# Patient Record
Sex: Female | Born: 1955 | Race: Black or African American | Hispanic: No | Smoking: Never smoker
Health system: Southern US, Community
[De-identification: ages and names within clinical notes are randomized; demographics above are authoritative.]

---

## 2014-09-10 DIAGNOSIS — L659 Nonscarring hair loss, unspecified: Secondary | ICD-10-CM | POA: Insufficient documentation

## 2014-09-10 DIAGNOSIS — K439 Ventral hernia without obstruction or gangrene: Secondary | ICD-10-CM | POA: Insufficient documentation

## 2014-10-23 DIAGNOSIS — K429 Umbilical hernia without obstruction or gangrene: Secondary | ICD-10-CM | POA: Insufficient documentation

## 2015-02-08 DIAGNOSIS — L0291 Cutaneous abscess, unspecified: Secondary | ICD-10-CM | POA: Insufficient documentation

## 2015-02-08 DIAGNOSIS — L0390 Cellulitis, unspecified: Secondary | ICD-10-CM | POA: Insufficient documentation

## 2020-09-02 DIAGNOSIS — L409 Psoriasis, unspecified: Secondary | ICD-10-CM | POA: Insufficient documentation

## 2020-09-02 DIAGNOSIS — I10 Essential (primary) hypertension: Secondary | ICD-10-CM | POA: Insufficient documentation

## 2020-09-02 DIAGNOSIS — IMO0002 Reserved for concepts with insufficient information to code with codable children: Secondary | ICD-10-CM | POA: Insufficient documentation

## 2020-09-02 DIAGNOSIS — E559 Vitamin D deficiency, unspecified: Secondary | ICD-10-CM | POA: Insufficient documentation

## 2020-09-02 DIAGNOSIS — E785 Hyperlipidemia, unspecified: Secondary | ICD-10-CM | POA: Insufficient documentation

## 2020-09-02 DIAGNOSIS — K219 Gastro-esophageal reflux disease without esophagitis: Secondary | ICD-10-CM | POA: Insufficient documentation

## 2020-09-02 DIAGNOSIS — M170 Bilateral primary osteoarthritis of knee: Secondary | ICD-10-CM | POA: Insufficient documentation

## 2020-09-02 DIAGNOSIS — K589 Irritable bowel syndrome without diarrhea: Secondary | ICD-10-CM | POA: Insufficient documentation

## 2020-09-02 DIAGNOSIS — E119 Type 2 diabetes mellitus without complications: Secondary | ICD-10-CM | POA: Insufficient documentation

## 2022-01-08 ENCOUNTER — Telehealth: Payer: Self-pay

## 2022-01-08 NOTE — Telephone Encounter (Signed)
LMOM to call back to set up appointment

## 2022-01-10 ENCOUNTER — Other Ambulatory Visit: Payer: Self-pay

## 2022-01-10 ENCOUNTER — Ambulatory Visit (INDEPENDENT_AMBULATORY_CARE_PROVIDER_SITE_OTHER): Payer: Managed Care, Other (non HMO) | Admitting: Podiatry

## 2022-01-10 ENCOUNTER — Ambulatory Visit (INDEPENDENT_AMBULATORY_CARE_PROVIDER_SITE_OTHER): Payer: Medicare PPO

## 2022-01-10 DIAGNOSIS — M722 Plantar fascial fibromatosis: Secondary | ICD-10-CM

## 2022-01-10 NOTE — Patient Instructions (Signed)

## 2022-01-14 NOTE — Progress Notes (Signed)
°  Subjective:  Patient ID: Hayley Barr, female    DOB: 07-Feb-1956,  MRN: [id number]  Chief Complaint  Patient presents with   Foot Pain    66 y.o. female presents with the above complaint. History confirmed with patient.  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  Has been doing stretching exercises but they have not helped  Objective:  Physical Exam: warm, good capillary refill, no trophic changes or ulcerative lesions, normal DP and PT pulses, and normal sensory exam. Left Foot: normal exam, no swelling, tenderness, instability; ligaments intact, full range of motion of all ankle/foot joints Right Foot: point tenderness over the heel pad  No images are attached to the encounter.  Radiographs: Multiple views x-ray of the right foot: no fracture, dislocation, swelling or degenerative changes noted and plantar calcaneal spur Assessment:   1. Plantar fasciitis of right foot      Plan:  Patient was evaluated and treated and all questions answered.  Discussed the etiology and treatment options for plantar fasciitis including stretching, formal physical therapy, supportive shoegears such as a running shoe or sneaker, pre fabricated orthoses, injection therapy, and oral medications. We also discussed the role of surgical treatment of this for patients who do not improve after exhausting non-surgical treatment options.   -XR reviewed with patient -Educated patient on stretching and icing of the affected limb -Injection delivered to the plantar fascia of the right foot. -She has Mobic but has not helped with this.  Would like to avoid prednisone with her diabetes -Also recommended orthotics and she purchased power steps today  After sterile prep with povidone-iodine solution and alcohol, the right heel was injected with 0.5cc 2% xylocaine plain, 0.5cc 0.5% marcaine plain, 5mg  triamcinolone acetonide, and 2mg  dexamethasone was injected along the medial plantar fascia at the  insertion on the plantar calcaneus. The patient tolerated the procedure well without complication.  Return in about 1 month (around 02/07/2022) for recheck plantar fasciitis.

## 2022-02-12 ENCOUNTER — Ambulatory Visit: Payer: Medicare PPO | Admitting: Podiatry

## 2022-02-12 ENCOUNTER — Other Ambulatory Visit: Payer: Self-pay

## 2022-02-12 DIAGNOSIS — M722 Plantar fascial fibromatosis: Secondary | ICD-10-CM

## 2022-02-15 NOTE — Progress Notes (Signed)
?  Subjective:  ?Patient ID: Hayley Barr, female    DOB: 07-29-1956,  MRN: [id number] ? ?Chief Complaint  ?Patient presents with  ? Plantar Fasciitis  ?  "Its definitely better, but some days are worse than others"  ? ? ?66 y.o. female presents with the above complaint. History confirmed with patient.  Doing better after the injection about 50% ? ?Objective:  ?Physical Exam: ?warm, good capillary refill, no trophic changes or ulcerative lesions, normal DP and PT pulses, and normal sensory exam. ?Left Foot: normal exam, no swelling, tenderness, instability; ligaments intact, full range of motion of all ankle/foot joints ?Right Foot: point tenderness over the heel pad, minimal only in the central heel pad it is much improved since last visit ? ?No images are attached to the encounter. ? ?Radiographs: ?Multiple views x-ray of the right foot: no fracture, dislocation, swelling or degenerative changes noted and plantar calcaneal spur ?Assessment:  ? ?1. Plantar fasciitis of right foot   ? ? ? ?Plan:  ?Patient was evaluated and treated and all questions answered. ? ?Discussed the etiology and treatment options for plantar fasciitis including stretching, formal physical therapy, supportive shoegears such as a running shoe or sneaker, pre fabricated orthoses, injection therapy, and oral medications. We also discussed the role of surgical treatment of this for patients who do not improve after exhausting non-surgical treatment options. ? ? ?Overall doing well and has had about 50% improvement I recommend she continue her home therapy plan and exercises twice daily.  If not improved by next visit we will consider 1 last injection but hopefully should be resolved by that point. ? ?Return in about 6 weeks (around 03/26/2022) for recheck plantar fasciitis.  ? ?

## 2022-03-26 ENCOUNTER — Ambulatory Visit (INDEPENDENT_AMBULATORY_CARE_PROVIDER_SITE_OTHER): Payer: Medicare PPO | Admitting: Podiatry

## 2022-03-26 DIAGNOSIS — M722 Plantar fascial fibromatosis: Secondary | ICD-10-CM

## 2022-03-27 ENCOUNTER — Encounter: Payer: Self-pay | Admitting: Podiatry

## 2022-03-27 NOTE — Progress Notes (Signed)
?  Subjective:  ?Patient ID: Hayley Barr, female    DOB: [birth year],  MRN: [id number] ? ?Chief Complaint  ?Patient presents with  ? Plantar Fasciitis  ?  Right foot follow up  ? ? ?66 y.o. female presents with the above complaint. History confirmed with patient.  Feels about the same as last visit ? ?Objective:  ?Physical Exam: ?warm, good capillary refill, no trophic changes or ulcerative lesions, normal DP and PT pulses, and normal sensory exam. ?Left Foot: normal exam, no swelling, tenderness, instability; ligaments intact, full range of motion of all ankle/foot joints ?Right Foot: point tenderness over the heel pad, about the same as last visit also has pain on the medial insertion ? ?No images are attached to the encounter. ? ?Radiographs: ?Multiple views x-ray of the right foot: no fracture, dislocation, swelling or degenerative changes noted and plantar calcaneal spur ?Assessment:  ? ?1. Plantar fasciitis of right foot   ? ? ? ?Plan:  ?Patient was evaluated and treated and all questions answered. ? ?Has had only slight improvement since last visit.  I recommend a repeat injection and continuing her home therapy plan and meloxicam.  Hopefully should resolve after this point. ? ?After sterile prep with povidone-iodine solution and alcohol, the right heel was injected with 0.5cc 2% xylocaine plain, 0.5cc 0.5% marcaine plain, 5mg  triamcinolone acetonide, and 2mg  dexamethasone was injected along the medial plantar fascia at the insertion on the plantar calcaneus. The patient tolerated the procedure well without complication. ? ? ?No follow-ups on file.  ? ?

## 2022-04-24 ENCOUNTER — Encounter: Payer: Self-pay | Admitting: Podiatry

## 2022-04-24 ENCOUNTER — Ambulatory Visit (INDEPENDENT_AMBULATORY_CARE_PROVIDER_SITE_OTHER): Payer: Medicare PPO | Admitting: Podiatry

## 2022-04-24 DIAGNOSIS — M7671 Peroneal tendinitis, right leg: Secondary | ICD-10-CM | POA: Diagnosis not present

## 2022-04-24 MED ORDER — TRAMADOL HCL 50 MG PO TABS: 50.0000 mg | ORAL_TABLET | Freq: Three times a day (TID) | ORAL | 0 refills | Status: AC | PRN

## 2022-04-24 NOTE — Progress Notes (Signed)
Subjective:  Patient ID: Hayley Barr, female    DOB: Dec 24, 1955,  MRN: [id number]  Chief Complaint  Patient presents with   Foot Pain    66 y.o. female presents with the above complaint.  Patient presents with a new complaint of right lateral foot pain.  She states been hurting for quite some time.  She is it hurts with ambulation.  She had to go to [facility] on Saturday because she was having severe pain on the foot.  She is known to Dr. Abbott Pao has been treating her for Planter fasciitis.  She states it hurts with ambulation.  The Medrol Dosepak did not help.  Review of Systems: Negative except as noted in the HPI. Denies N/V/F/Ch.  No past medical history on file.  Current Outpatient Medications:    traMADol (ULTRAM) 50 MG tablet, Take 1 tablet (50 mg total) by mouth every 8 (eight) hours as needed for up to 10 days., Disp: 30 tablet, Rfl: 0   albuterol (VENTOLIN HFA) 108 (90 Base) MCG/ACT inhaler, Inhale into the lungs., Disp: , Rfl:    Cholecalciferol 10 MCG /0.028ML LIQD, See admin instructions., Disp: , Rfl:    clobetasol cream (TEMOVATE) 0.05 %, Apply topically., Disp: , Rfl:    esomeprazole (NEXIUM) 20 MG capsule, Take by mouth., Disp: , Rfl:    hydrochlorothiazide (HYDRODIURIL) 25 MG tablet, Take by mouth., Disp: , Rfl:    meloxicam (MOBIC) 15 MG tablet, Take by mouth., Disp: , Rfl:    metFORMIN (GLUCOPHAGE) 1000 MG tablet, Take by mouth., Disp: , Rfl:    simvastatin (ZOCOR) 40 MG tablet, Take by mouth., Disp: , Rfl:    ustekinumab (STELARA) 90 MG/ML SOSY injection, Inject into the skin., Disp: , Rfl:   Social History   Tobacco Use  Smoking Status Not on file  Smokeless Tobacco Not on file    Allergies  Allergen Reactions   Penicillins Nausea And Vomiting, Nausea Only and Other (See Comments)    Other reaction(s): GI Intolerance vomiting vomiting    Objective:  There were no vitals filed for this visit. There is no height or weight on file to calculate  BMI. Constitutional Well developed. Well nourished.  Vascular Dorsalis pedis pulses palpable bilaterally. Posterior tibial pulses palpable bilaterally. Capillary refill normal to all digits.  No cyanosis or clubbing noted. Pedal hair growth normal.  Neurologic Normal speech. Oriented to person, place, and time. Epicritic sensation to light touch grossly present bilaterally.  Dermatologic Nails well groomed and normal in appearance. No open wounds. No skin lesions.  Orthopedic: Pain on palpation along the course of the peroneal tendon.  Pain with dorsiflexion eversion of the foot.  No pain with plantarflexion inversion of the foot.  Nonpitting edema noted around the peroneal tendon.  No pain at the Achilles tendon, posterior tibial tendon.  Mild pain at the ATFL ligament   Radiographs: None Assessment:   1. Peroneal tendinitis of lower leg, right    Plan:  Patient was evaluated and treated and all questions answered.  Right peroneal tendinitis -All questions and concerns were discussed with the patient in extensive detail -Given the amount of pain that she is experiencing I believe she will benefit from a cam boot immobilization Cam boot was dispensed this will help the ankle be immobilized and allow the tendon to calm himself down if there is no improvement we will discuss a steroid injection versus an MRI during next clinical visit. -Tramadol was dispensed for pain control  No follow-ups on  file.

## 2022-04-27 ENCOUNTER — Telehealth: Payer: Self-pay | Admitting: Podiatry

## 2022-04-27 ENCOUNTER — Other Ambulatory Visit: Payer: Self-pay | Admitting: Podiatry

## 2022-04-27 ENCOUNTER — Telehealth: Payer: Self-pay | Admitting: *Deleted

## 2022-04-27 MED ORDER — IBUPROFEN 800 MG PO TABS
800.0000 mg | ORAL_TABLET | Freq: Three times a day (TID) | ORAL | 0 refills | Status: DC | PRN
Start: 2022-04-27 — End: 2022-05-15

## 2022-04-27 NOTE — Telephone Encounter (Signed)
"  I called yesterday.  I'm having a whole lot of foot pain.  I saw Dr. Allena Katz on Tuesday.  I'm wondering if I can get some stronger medication or some type of recommendation of what to do.  Can you call me back?"

## 2022-04-27 NOTE — Telephone Encounter (Signed)
Patient called the answering service stating she is still having a lot of pain. She is wearing the CAM Boot but not sure if she is wearing it correctly. I discussed this. She has been icing it. The tramadol is not helping and she is still having pain. She is taking OTC NSAIDs at times as well. I have sent Ibuprofen 800mg  TID to take as well in between the tramadol. Encouraged to continue icing, elevation.   I told her I would sent a message to the doctors that have treated her about getting a MRI or what the next steps would be.   Patient states she has tried calling the office 2 times and never received a response. I apologized.

## 2022-04-27 NOTE — Telephone Encounter (Signed)
"  I'm calling for my mother, Hayley Barr.  She called yesterday.  The medication is not working at all.  She's in excruciating pain.  I don't know what can be done.  We called yesterday to see what options are.  She's at a point where she can't sleep.  You can call me or her.

## 2022-04-30 ENCOUNTER — Telehealth: Payer: Self-pay | Admitting: *Deleted

## 2022-04-30 NOTE — Telephone Encounter (Signed)
Patient and daughter left messages on [facility] nurse VM-stating toes are numb and swollen and would like a call back please

## 2022-04-30 NOTE — Telephone Encounter (Signed)
Patient and daughter calling because she is experiencing excruciating pain in arch of foot, unable to sleep, pain medicine prescribed over the week end is causing a rash, red bumps.requesting that MRI recommended,sooner appointment as well.

## 2022-05-01 ENCOUNTER — Other Ambulatory Visit: Payer: Self-pay | Admitting: Podiatry

## 2022-05-01 DIAGNOSIS — M722 Plantar fascial fibromatosis: Secondary | ICD-10-CM

## 2022-05-01 DIAGNOSIS — M7671 Peroneal tendinitis, right leg: Secondary | ICD-10-CM

## 2022-05-01 NOTE — Progress Notes (Signed)
Spoke with the patient's daughter by messaging, continues to have significant pain she saw Dr. Posey Pronto for peroneal tendinitis as well.  Has not improved despite multiple injections physical therapy at home and bracing and anti-inflammatories.  I have ordered an MRI to evaluate the lateral ankle and plantar fascia.  Has an upcoming appoint with Dr. Posey Pronto scheduled.

## 2022-05-04 ENCOUNTER — Ambulatory Visit
Admission: RE | Admit: 2022-05-04 | Discharge: 2022-05-04 | Disposition: A | Discharge status: Home / Self Care | Payer: Medicare PPO | Source: Ambulatory Visit | Attending: Podiatry | Admitting: Podiatry

## 2022-05-04 DIAGNOSIS — M722 Plantar fascial fibromatosis: Secondary | ICD-10-CM

## 2022-05-04 DIAGNOSIS — M7671 Peroneal tendinitis, right leg: Secondary | ICD-10-CM

## 2022-05-07 ENCOUNTER — Ambulatory Visit (INDEPENDENT_AMBULATORY_CARE_PROVIDER_SITE_OTHER): Payer: Medicare PPO | Admitting: Podiatry

## 2022-05-07 ENCOUNTER — Telehealth: Payer: Self-pay | Admitting: *Deleted

## 2022-05-07 ENCOUNTER — Encounter: Payer: Self-pay | Admitting: Podiatry

## 2022-05-07 DIAGNOSIS — M7671 Peroneal tendinitis, right leg: Secondary | ICD-10-CM

## 2022-05-07 DIAGNOSIS — M722 Plantar fascial fibromatosis: Secondary | ICD-10-CM | POA: Diagnosis not present

## 2022-05-07 DIAGNOSIS — M10471 Other secondary gout, right ankle and foot: Secondary | ICD-10-CM

## 2022-05-07 DIAGNOSIS — M84374A Stress fracture, right foot, initial encounter for fracture: Secondary | ICD-10-CM | POA: Diagnosis not present

## 2022-05-07 NOTE — Telephone Encounter (Signed)
"  I did an MRI on Friday morning.  I'm calling to see if you have results for the MRI.  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

## 2022-05-08 ENCOUNTER — Telehealth: Payer: Self-pay | Admitting: Podiatry

## 2022-05-08 NOTE — Telephone Encounter (Signed)
Patient wanted to follow and make sure that she was still being referred for a MRI.  Please advise

## 2022-05-10 ENCOUNTER — Telehealth: Payer: Self-pay | Admitting: Podiatry

## 2022-05-10 NOTE — Telephone Encounter (Signed)
Pt called to let you know she went to her pcp that is at [facility] and they did her uric acid and they say you should be able to see it. But it was normal. She is scheduled for the mri next week. She is just wanted to let you know she is following thru what you have recommended and is just wanting to know what is going on with the foot. She is aware Dr Lilian Kapur is out of the office the rest of this week.

## 2022-05-10 NOTE — Telephone Encounter (Signed)
MRI was ordered by Dr. Lilian Kapur on 05/01/22.

## 2022-05-11 NOTE — Progress Notes (Signed)
Subjective:  Patient ID: Hayley Barr, female    DOB: 08/24/1956,  MRN: [id number]  Chief Complaint  Patient presents with   Plantar Fasciitis    Right foot - MRI results    66 y.o. female presents with the above complaint. History confirmed with patient.  She completed the MRI that I have ordered she previously saw Dr. Allena Katz and was having peroneal tendon pain  Objective:  Physical Exam: warm, good capillary refill, no trophic changes or ulcerative lesions, normal DP and PT pulses, and normal sensory exam. Left Foot: normal exam, no swelling, tenderness, instability; ligaments intact, full range of motion of all ankle/foot joints Right Foot: Pain today is not so much on the plantar fascia but in the distal forefoot around the second and third toes and along the lateral ankle  No images are attached to the encounter.  Radiographs: Multiple views x-ray of the right foot: no fracture, dislocation, swelling or degenerative changes noted and plantar calcaneal spur  Uric acid 6.4 on 05/09/2022   CLINICAL DATA:  Right ankle pain, swelling, weakness and stiffness. Symptoms for 2-3 weeks. Symptoms are chronic.   EXAM: MR OF THE RIGHT HEEL WITHOUT CONTRAST   TECHNIQUE: Multiplanar, multisequence MR imaging of the ankle was performed. No intravenous contrast was administered.   COMPARISON:  Plain films right foot 01/10/2022.   FINDINGS: TENDONS   Peroneal: Intact.   Posteromedial: Intact.   Anterior: Intact.   Achilles: Intact.   Plantar Fascia: Intact. There is intrasubstance increased T2 signal in the medial cord of the plantar fascia with some reactive marrow edema in the calcaneus at the plantar fascia attachment site.   LIGAMENTS   Lateral: Intact.   Medial: Intact.   CARTILAGE   Ankle Joint: The patient has mild to moderate tibiotalar degenerative change with cartilage thinning and some subchondral cyst formation in the posterior aspect of the tibial  plafond.   Subtalar Joints/Sinus Tarsi: Normal.   Bones: No fracture, stress change or worrisome lesion. 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 Moderate degenerative change is present at the fifth tarsometatarsal joint.   Other: None.   IMPRESSION: The examination is positive for plantar fasciitis of the medial cord without rupture of the plantar fascia.   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     Electronically Signed   By: Drusilla Kanner M.D.   On: 05/06/2022 08:11 Assessment:   1. 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 Stress fracture of metatarsal bone of right foot, initial encounter   3. Peroneal tendinitis of lower leg, right   4. Plantar fasciitis of right foot      Plan:  Patient was evaluated and treated and all questions answered.  I reviewed the results of the MRI with the patient and her daughter.  I discussed that the peroneals on the study appear normal and my independent review of these images confirms this as well.  She is not having much pain today on the actual plantar fascia although she does have significant planter fasciitis noted in the MRI.  She does have some diffuse pain in the distal forefoot, there is no evidence of stress fracture on her previous plain film images the MRI did not extend to this level and I have recommended a forefoot MRI as well which I ordered.  I also recommended uric acid that she will have checked at her PCP which  came back at 6.4.  Likely not gout but cannot completely excluded at this point.  I think if the MRI is nonrevealing then we may need to consider using a prednisone taper to alleviate her symptoms further.  At this point I do not think she will be able to go back to work and would like to keep her out of work and wrote her a note for this as well.  I will see her back after the forefoot  MRI.   No follow-ups on file.

## 2022-05-14 ENCOUNTER — Encounter: Payer: Self-pay | Admitting: Podiatry

## 2022-05-14 ENCOUNTER — Ambulatory Visit
Admission: RE | Admit: 2022-05-14 | Discharge: 2022-05-14 | Disposition: A | Discharge status: Home / Self Care | Payer: Medicare PPO | Source: Ambulatory Visit | Attending: Podiatry | Admitting: Podiatry

## 2022-05-14 ENCOUNTER — Telehealth: Payer: Self-pay | Admitting: Podiatry

## 2022-05-14 ENCOUNTER — Other Ambulatory Visit (INDEPENDENT_AMBULATORY_CARE_PROVIDER_SITE_OTHER): Payer: Self-pay | Admitting: Family Medicine

## 2022-05-14 DIAGNOSIS — L819 Disorder of pigmentation, unspecified: Secondary | ICD-10-CM

## 2022-05-14 DIAGNOSIS — M84374A Stress fracture, right foot, initial encounter for fracture: Secondary | ICD-10-CM

## 2022-05-14 NOTE — Telephone Encounter (Signed)
error 

## 2022-05-14 NOTE — Telephone Encounter (Signed)
Patient called she would like her FMLA extended she does not feel that she is ready to go back to work yet. Patient states that her foot is still swollen and painful, she cant drive with the boot on . She goes for her MRI today and will call to schedule follow up from there. Please advise

## 2022-05-14 NOTE — Telephone Encounter (Signed)
Yes I think should be extended. Can you keep her out for 4 additional weeks (if it is in groups of 6 weeks, that is fine too)

## 2022-05-15 ENCOUNTER — Ambulatory Visit (INDEPENDENT_AMBULATORY_CARE_PROVIDER_SITE_OTHER): Payer: Medicare PPO | Admitting: Vascular Surgery

## 2022-05-15 ENCOUNTER — Encounter (INDEPENDENT_AMBULATORY_CARE_PROVIDER_SITE_OTHER): Payer: Self-pay | Admitting: Vascular Surgery

## 2022-05-15 ENCOUNTER — Ambulatory Visit (INDEPENDENT_AMBULATORY_CARE_PROVIDER_SITE_OTHER): Payer: Medicare PPO

## 2022-05-15 VITALS — BP 146/81 | HR 82 | Resp 16 | Ht 65.5 in | Wt 258.0 lb

## 2022-05-15 DIAGNOSIS — E785 Hyperlipidemia, unspecified: Secondary | ICD-10-CM

## 2022-05-15 DIAGNOSIS — M79671 Pain in right foot: Secondary | ICD-10-CM | POA: Diagnosis not present

## 2022-05-15 DIAGNOSIS — L819 Disorder of pigmentation, unspecified: Secondary | ICD-10-CM

## 2022-05-15 DIAGNOSIS — I10 Essential (primary) hypertension: Secondary | ICD-10-CM

## 2022-05-15 DIAGNOSIS — E119 Type 2 diabetes mellitus without complications: Secondary | ICD-10-CM

## 2022-05-15 NOTE — Assessment & Plan Note (Signed)
The patient has unexplained foot pain and toe pain on the right.  No left leg symptoms.  Noninvasive studies today show completely normal arterial perfusion with ABIs of 1.22 on the right and 1.23 on the left.  Waveforms are triphasic.  Digital pressures are normal and digital waveforms are normal bilaterally.  Her symptoms are not from arterial malperfusion.  This does not have a typical appearance of atheroembolic disease.  I do not think there is a primary vascular cause of her pain, although I do not know what the cause of her pain is.  Given the odd skin rash and the joint pain in her foot, I think a rheumatology evaluation would be very reasonable.  I will see her back as needed.

## 2022-05-15 NOTE — Progress Notes (Signed)
Patient ID: Hayley Barr, female   DOB: 09-Dec-1955, 66 y.o.   MRN: [id number]  No chief complaint on file.   HPI Hayley Barr is a 66 y.o. female.  I am asked to see the patient by Ardyth Man for evaluation of right foot pain and skin changes.  A couple of months ago, the patient began having abrupt right second toe and foot pain.  She has seen a litany of people including orthopedics, podiatry, and her primary care physician.  She was started on ibuprofen and developed an odd rash on the top of that right foot.  This is all been associated with significant swelling of the right foot and ankle.  She does have significant plantar fasciitis of that right foot and has seen podiatry in the past for this, but this is a new symptom and a new type of pain for her.  No previous history of DVT or superficial thrombophlebitis to her knowledge but she says many family members have had clotting issues in the past.  No open wounds.  No left leg symptoms.  No fever or chills.   Noninvasive studies today show completely normal arterial perfusion with ABIs of 1.22 on the right and 1.23 on the left.  Waveforms are triphasic.  Digital pressures are normal and digital waveforms are normal bilaterally.   Past Medical History Diabetes Hypertension Hyperlidiemia  No past surgical history on file.   Family History Patient states she has multiple family members with autoimmune diseases, clotting issues.  No aneurysms.   Social History   Tobacco Use   Smoking status: Never   Smokeless tobacco: Never  Substance Use Topics   Alcohol use: Never   Drug use: Never     Allergies  Allergen Reactions   Penicillins Nausea And Vomiting, Nausea Only and Other (See Comments)    Other reaction(s): GI Intolerance vomiting vomiting     Current Outpatient Medications  Medication Sig Dispense Refill   albuterol (VENTOLIN HFA) 108 (90 Base) MCG/ACT inhaler Inhale into the lungs.     clobetasol cream  (TEMOVATE) 0.05 % Apply topically.     esomeprazole (NEXIUM) 20 MG capsule Take by mouth.     gabapentin (NEURONTIN) 300 MG capsule Take by mouth.     metFORMIN (GLUCOPHAGE) 1000 MG tablet Take by mouth.     naproxen (NAPROSYN) 500 MG tablet Take 500 mg by mouth 2 (two) times daily as needed.     Semaglutide,0.25 or 0.5MG /DOS, 2 MG/3ML SOPN Inject into the skin.     ustekinumab (STELARA) 90 MG/ML SOSY injection Inject into the skin.     simvastatin (ZOCOR) 40 MG tablet Take by mouth.     No current facility-administered medications for this visit.      REVIEW OF SYSTEMS (Negative unless checked)  Constitutional: [] Weight loss  [] Fever  [] Chills Cardiac: [] Chest pain   [] Chest pressure   [] Palpitations   [] Shortness of breath when laying flat   [] Shortness of breath at rest   [] Shortness of breath with exertion. Vascular:  [] Pain in legs with walking   [] Pain in legs at rest   [] Pain in legs when laying flat   [] Claudication   [x] Pain in feet when walking  [x] Pain in feet at rest  [x] Pain in feet when laying flat   [] History of DVT   [] Phlebitis   [x] Swelling in legs   [] Varicose veins   [] Non-healing ulcers Pulmonary:   [] Uses home oxygen   [] Productive cough   [] Hemoptysis   []   Wheeze  [] COPD   [] Asthma Neurologic:  [] Dizziness  [] Blackouts   [] Seizures   [] History of stroke   [] History of TIA  [] Aphasia   [] Temporary blindness   [] Dysphagia   [] Weakness or numbness in arms   [] Weakness or numbness in legs Musculoskeletal:  [x] Arthritis   [] Joint swelling   [x] Joint pain   [] Low back pain Hematologic:  [] Easy bruising  [] Easy bleeding   [] Hypercoagulable state   [] Anemic  [] Hepatitis Gastrointestinal:  [] Blood in stool   [] Vomiting blood  [] Gastroesophageal reflux/heartburn   [] Abdominal pain Genitourinary:  [] Chronic kidney disease   [] Difficult urination  [] Frequent urination  [] Burning with urination   [] Hematuria Skin:  [] Rashes   [] Ulcers   [] Wounds Psychological:  [] History of anxiety    []  History of major depression.    Physical Exam BP (!) 146/81 (BP Location: Left Arm)   Pulse 82   Resp 16   Ht 5' 5.5" (1.664 m)   Wt 258 lb (117 kg)   BMI 42.28 kg/m  Gen:  WD/WN, NAD.  Appears younger than stated age Head: [place]/AT, No temporalis wasting.  Ear/Nose/Throat: Hearing grossly intact, nares w/o erythema or drainage, oropharynx w/o Erythema/Exudate Eyes: Conjunctiva clear, sclera non-icteric  Neck: trachea midline.  No JVD.  Pulmonary:  Good air movement, respirations not labored, no use of accessory muscles  Cardiac: RRR, no JVD Vascular:  Vessel Right Left  Radial Palpable Palpable                          DP 1+ 2+  PT Trace 1+   Gastrointestinal:. No masses, surgical incisions, or scars. Musculoskeletal: M/S 5/5 throughout.  Macular rash on the dorsal aspect of the right foot and ankle.  2+ right lower extremity edema with trace left lower extremity edema. Neurologic: Sensation grossly intact in extremities.  Symmetrical.  Speech is fluent. Motor exam as listed above. Psychiatric: Judgment intact, Mood & affect appropriate for pt's clinical situation. Dermatologic: No rashes or ulcers noted.  No cellulitis or open wounds.    Radiology MR HEEL RIGHT WO CONTRAST  Result Date: 05/06/2022 CLINICAL DATA:  Right ankle pain, swelling, weakness and stiffness. Symptoms for 2-3 weeks. Symptoms are chronic. EXAM: MR OF THE RIGHT HEEL WITHOUT CONTRAST TECHNIQUE: Multiplanar, multisequence MR imaging of the ankle was performed. No intravenous contrast was administered. COMPARISON:  Plain films right foot 01/10/2022. FINDINGS: TENDONS Peroneal: Intact. Posteromedial: Intact. Anterior: Intact. Achilles: Intact. Plantar Fascia: Intact. There is intrasubstance increased T2 signal in the medial cord of the plantar fascia with some reactive marrow edema in the calcaneus at the plantar fascia attachment site. LIGAMENTS Lateral: Intact. Medial: Intact. CARTILAGE Ankle Joint: The  patient has mild to moderate tibiotalar degenerative change with cartilage thinning and some subchondral cyst formation in the posterior aspect of the tibial plafond. Subtalar Joints/Sinus Tarsi: Normal. Bones: No fracture, stress change or worrisome lesion. 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 Moderate degenerative change is present at the fifth tarsometatarsal joint. Other: None. IMPRESSION: The examination is positive for plantar fasciitis of the medial cord without rupture of the plantar fascia. 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 Electronically Signed   By: M.D.   On: 05/06/2022 08:11    Labs No results found for this or any previous visit (from the past 2160 hour(s)).  Assessment/Plan:  Foot pain, right The patient has unexplained foot pain and toe  pain on the right.  No left leg symptoms.  Noninvasive studies today show completely normal arterial perfusion with ABIs of 1.22 on the right and 1.23 on the left.  Waveforms are triphasic.  Digital pressures are normal and digital waveforms are normal bilaterally.  Her symptoms are not from arterial malperfusion.  This does not have a typical appearance of atheroembolic disease.  I do not think there is a primary vascular cause of her pain, although I do not know what the cause of her pain is.  Given the odd skin rash and the joint pain in her foot, I think a rheumatology evaluation would be very reasonable.  I will see her back as needed.  Benign essential hypertension blood pressure control important in reducing the progression of atherosclerotic disease. On appropriate oral medications.   Diabetes (HCC) blood glucose control important in reducing the progression of atherosclerotic disease. Also, involved in wound healing. On appropriate medications.   Dyslipidemia, goal LDL below 100 lipid control important in reducing the  progression of atherosclerotic disease. Continue statin therapy      Festus Barren 05/15/2022, 4:46 PM   This note was created with Dragon medical transcription system.  Any errors from dictation are unintentional.

## 2022-05-15 NOTE — Assessment & Plan Note (Signed)
blood glucose control important in reducing the progression of atherosclerotic disease. Also, involved in wound healing. On appropriate medications.  

## 2022-05-15 NOTE — Assessment & Plan Note (Signed)
blood pressure control important in reducing the progression of atherosclerotic disease. On appropriate oral medications.  

## 2022-05-15 NOTE — Assessment & Plan Note (Signed)
lipid control important in reducing the progression of atherosclerotic disease. Continue statin therapy  

## 2022-05-22 ENCOUNTER — Ambulatory Visit: Payer: Medicare PPO | Admitting: Podiatry

## 2022-05-23 ENCOUNTER — Ambulatory Visit: Payer: Medicare PPO | Admitting: Podiatry

## 2022-05-23 DIAGNOSIS — M19071 Primary osteoarthritis, right ankle and foot: Secondary | ICD-10-CM

## 2022-05-26 ENCOUNTER — Encounter: Payer: Self-pay | Admitting: Podiatry

## 2022-05-26 NOTE — Progress Notes (Signed)
Subjective:  Patient ID: Hayley Barr, female    DOB: 1956-08-07,  MRN: [id number]  Chief Complaint  Patient presents with   Plantar Fasciitis     right foot follow up after MRI    66 y.o. female presents with the above complaint. History confirmed with patient.  She completed the forefoot MRI since last visit has been much better the skin seems to be improving she did see rheumatology who ordered lab work and placed from medication and recommended that her primary perform a skin biopsy which was scheduled for Friday  Objective:  Physical Exam: warm, good capillary refill, no trophic changes or ulcerative lesions, normal DP and PT pulses, and normal sensory exam. Left Foot: normal exam, no swelling, tenderness, instability; ligaments intact, full range of motion of all ankle/foot joints Right Foot: Pain today around the TMT J's, less in the metatarsals distally now.  Skin is improving  No images are attached to the encounter.  Radiographs: Multiple views x-ray of the right foot: no fracture, dislocation, swelling or degenerative changes noted and plantar calcaneal spur  Uric acid 6.4 on 05/09/2022   CLINICAL DATA:  Right ankle pain, swelling, weakness and stiffness. Symptoms for 2-3 weeks. Symptoms are chronic.   EXAM: MR OF THE RIGHT HEEL WITHOUT CONTRAST   TECHNIQUE: Multiplanar, multisequence MR imaging of the ankle was performed. No intravenous contrast was administered.   COMPARISON:  Plain films right foot 01/10/2022.   FINDINGS: TENDONS   Peroneal: Intact.   Posteromedial: Intact.   Anterior: Intact.   Achilles: Intact.   Plantar Fascia: Intact. There is intrasubstance increased T2 signal in the medial cord of the plantar fascia with some reactive marrow edema in the calcaneus at the plantar fascia attachment site.   LIGAMENTS   Lateral: Intact.   Medial: Intact.   CARTILAGE   Ankle Joint: The patient has mild to moderate tibiotalar degenerative  change with cartilage thinning and some subchondral cyst formation in the posterior aspect of the tibial plafond.   Subtalar Joints/Sinus Tarsi: Normal.   Bones: No fracture, stress change or worrisome lesion. 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 Moderate degenerative change is present at the fifth tarsometatarsal joint.   Other: None.   IMPRESSION: The examination is positive for plantar fasciitis of the medial cord without rupture of the plantar fascia.   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     Electronically Signed   By: Drusilla Kanner M.D.   On: 05/06/2022 08:11   IMPRESSION: No evidence of fracture.   Severe second and moderate third tarsometatarsal joint osteoarthritis.   Mild to moderate first MTP osteoarthritis with mild hallux valgus deformity.     Electronically Signed   By: Caprice Renshaw M.D.   On: 05/15/2022 10:33 Assessment:   1. Localized, primary osteoarthritis of the ankle and foot, right      Plan:  Patient was evaluated and treated and all questions answered.  I reviewed the results of the latest forefoot MRI with her.  Unfortunately I do not think that the majority of her symptoms are due to the same issue and are fairly confusing.  There may be something symptomatic at work.  I will allow rheumatology continue to work on this.  She does have significant osteoarthritis and pain in this area and I recommended corticosteroid injection to see if this alleviates her pain.  I do think she is able to come out of the  boot if she would like as I do not see any evidence of stress fracture that requires further immobilization.  The corticosteroid injection was performed after sterile prep with Betadine into the third and second TMT J's from a lateral approach with 10 mg of Kenalog and 4 mg of dexamethasone.  She tolerated this well.   Return in about 4 weeks  (around 06/20/2022) for follow up on right foot pain and inflammation .

## 2022-06-04 ENCOUNTER — Encounter: Payer: Self-pay | Admitting: Podiatry

## 2022-06-05 ENCOUNTER — Encounter: Payer: Self-pay | Admitting: Podiatry

## 2022-06-14 ENCOUNTER — Ambulatory Visit: Payer: Medicare PPO | Admitting: Podiatry

## 2022-06-18 ENCOUNTER — Ambulatory Visit (INDEPENDENT_AMBULATORY_CARE_PROVIDER_SITE_OTHER): Payer: Medicare PPO | Admitting: Podiatry

## 2022-06-18 ENCOUNTER — Encounter: Payer: Self-pay | Admitting: Podiatry

## 2022-06-18 DIAGNOSIS — M722 Plantar fascial fibromatosis: Secondary | ICD-10-CM | POA: Diagnosis not present

## 2022-06-18 NOTE — Patient Instructions (Signed)

## 2022-06-18 NOTE — Progress Notes (Signed)
Subjective:  Patient ID: Hayley Barr, female    DOB: Jun 08, 1956,  MRN: [id number]  Chief Complaint  Patient presents with   Plantar Fasciitis    "I'm not able to stand.  I have long term pain from undiagnosed shingles and Plantar Fasciitis.  I can't sleep at night.  I put ice on it and then I can fall asleep.  I'm using a compound for Neuropathy."    66 y.o. female presents with the above complaint. History confirmed with patient.  She had her biopsy and was diagnosed with postherpetic neuralgia, she is seeing neurology and rheumatology currently for this, they have increased her gabapentin dosing.  The midfoot injection I gave her did not help.  The arch is increasingly painful and she would like to have another injection in the plantar fascia to see if this at least alleviate some of this pain for her which is separate from the postherpetic neuralgia pain  Objective:  Physical Exam: warm, good capillary refill, no trophic changes or ulcerative lesions, normal DP and PT pulses, and normal sensory exam. Left Foot: normal exam, no swelling, tenderness, instability; ligaments intact, full range of motion of all ankle/foot joints Right Foot: Diffuse pain in the forefoot and toes, she does have pain on palpation to the mid plantar fascia  No images are attached to the encounter.  Radiographs: Multiple views x-ray of the right foot: no fracture, dislocation, swelling or degenerative changes noted and plantar calcaneal spur  Uric acid 6.4 on 05/09/2022   CLINICAL DATA:  Right ankle pain, swelling, weakness and stiffness. Symptoms for 2-3 weeks. Symptoms are chronic.   EXAM: MR OF THE RIGHT HEEL WITHOUT CONTRAST   TECHNIQUE: Multiplanar, multisequence MR imaging of the ankle was performed. No intravenous contrast was administered.   COMPARISON:  Plain films right foot 01/10/2022.   FINDINGS: TENDONS   Peroneal: Intact.   Posteromedial: Intact.   Anterior: Intact.    Achilles: Intact.   Plantar Fascia: Intact. There is intrasubstance increased T2 signal in the medial cord of the plantar fascia with some reactive marrow edema in the calcaneus at the plantar fascia attachment site.   LIGAMENTS   Lateral: Intact.   Medial: Intact.   CARTILAGE   Ankle Joint: The patient has mild to moderate tibiotalar degenerative change with cartilage thinning and some subchondral cyst formation in the posterior aspect of the tibial plafond.   Subtalar Joints/Sinus Tarsi: Normal.   Bones: No fracture, stress change or worrisome lesion. 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 Moderate degenerative change is present at the fifth tarsometatarsal joint.   Other: None.   IMPRESSION: The examination is positive for plantar fasciitis of the medial cord without rupture of the plantar fascia.   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     Electronically Signed   By: Drusilla Kanner M.D.   On: 05/06/2022 08:11   IMPRESSION: No evidence of fracture.   Severe second and moderate third tarsometatarsal joint osteoarthritis.   Mild to moderate first MTP osteoarthritis with mild hallux valgus deformity.     Electronically Signed   By: Caprice Renshaw M.D.   On: 05/15/2022 10:33 Assessment:   1. Plantar fasciitis of right foot       Plan:  Patient was evaluated and treated and all questions answered.  We discussed that the posterior pending neurology pain she has may be long-term and may take some time to go away and  I will defer further treatment for this to neurology and rheumatology.  I discussed with her that I think it is reasonable to pursue a corticosteroid injection the mid plantar fascia to see if this alleviates any more for pain and inflammation.  This was done today following sterile prep with alcohol and 2 mg of dexamethasone and 5 mg of Kenalog was  injected directly into the plantar fascia medial band at its insertion through medial approach.  She tolerated this well.  She will follow-up with me in 6 weeks for follow-up.  For now I do not think she will build to go back to work with her current condition I would expect to leave her out of this until mid September   Return in about 6 weeks (around 07/30/2022) for recheck plantar fasciitis.

## 2022-06-19 ENCOUNTER — Ambulatory Visit: Payer: Medicare PPO | Admitting: Podiatry

## 2022-06-20 ENCOUNTER — Encounter: Payer: Self-pay | Admitting: Podiatry

## 2022-08-01 ENCOUNTER — Ambulatory Visit (INDEPENDENT_AMBULATORY_CARE_PROVIDER_SITE_OTHER): Payer: Medicare PPO | Admitting: Podiatry

## 2022-08-01 DIAGNOSIS — B0229 Other postherpetic nervous system involvement: Secondary | ICD-10-CM | POA: Diagnosis not present

## 2022-08-01 DIAGNOSIS — M792 Neuralgia and neuritis, unspecified: Secondary | ICD-10-CM | POA: Diagnosis not present

## 2022-08-01 DIAGNOSIS — M722 Plantar fascial fibromatosis: Secondary | ICD-10-CM

## 2022-08-01 NOTE — Progress Notes (Signed)
Subjective:  Patient ID: Hayley Barr, female    DOB: 03/19/1956,  MRN: [id number]  Chief Complaint  Patient presents with   Plantar Fasciitis    6 week follow up right foot    66 y.o. female presents with the above complaint. History confirmed with patient.  She is making some improvement, she notes new discoloration on the top of the foot and diffuse tenderness around the top of the foot as well still.  The pain in the bottom of the foot and arch is better.  Physical therapy is helping.  Objective:  Physical Exam: warm, good capillary refill, no trophic changes or ulcerative lesions, normal DP and PT pulses, and normal sensory exam. Left Foot: normal exam, no swelling, tenderness, instability; ligaments intact, full range of motion of all ankle/foot joints Right Foot: Mid plantar fascia pain is resolved, she does have some dark discoloration of the dorsal foot, nonspecific tenderness in the foot and ankle as well  No images are attached to the encounter.  Radiographs: Multiple views x-ray of the right foot: no fracture, dislocation, swelling or degenerative changes noted and plantar calcaneal spur  Uric acid 6.4 on 05/09/2022   CLINICAL DATA:  Right ankle pain, swelling, weakness and stiffness. Symptoms for 2-3 weeks. Symptoms are chronic.   EXAM: MR OF THE RIGHT HEEL WITHOUT CONTRAST   TECHNIQUE: Multiplanar, multisequence MR imaging of the ankle was performed. No intravenous contrast was administered.   COMPARISON:  Plain films right foot 01/10/2022.   FINDINGS: TENDONS   Peroneal: Intact.   Posteromedial: Intact.   Anterior: Intact.   Achilles: Intact.   Plantar Fascia: Intact. There is intrasubstance increased T2 signal in the medial cord of the plantar fascia with some reactive marrow edema in the calcaneus at the plantar fascia attachment site.   LIGAMENTS   Lateral: Intact.   Medial: Intact.   CARTILAGE   Ankle Joint: The patient has mild to  moderate tibiotalar degenerative change with cartilage thinning and some subchondral cyst formation in the posterior aspect of the tibial plafond.   Subtalar Joints/Sinus Tarsi: Normal.   Bones: No fracture, stress change or worrisome lesion. 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 Moderate degenerative change is present at the fifth tarsometatarsal joint.   Other: None.   IMPRESSION: The examination is positive for plantar fasciitis of the medial cord without rupture of the plantar fascia.   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     Electronically Signed   By: Drusilla Kanner M.D.   On: 05/06/2022 08:11   IMPRESSION: No evidence of fracture.   Severe second and moderate third tarsometatarsal joint osteoarthritis.   Mild to moderate first MTP osteoarthritis with mild hallux valgus deformity.     Electronically Signed   By: Caprice Renshaw M.D.   On: 05/15/2022 10:33 Assessment:   1. Plantar fasciitis of right foot   2. Postherpetic neuralgia   3. Neuropathic pain of foot, right       Plan:  Patient was evaluated and treated and all questions answered.  Overall doing much better.  Her Planter fasciitis seems to be resolved at this point.  Is going to be a long slow recovery for her from her postop neuralgia which I suspect now is the primary source of her remaining pain in her foot and ankle.  I do not see any indication for further injections or procedures for her foot or ankle itself.  She  will follow-up with her PCP and neurologist.  Recommended good supportive shoe gear.  I agree with continuing physical therapy until she maximizes this as well.  She is still not ready to go back to work and we will extend her leave for an additional 6 weeks.   Return if symptoms worsen or fail to improve.

## 2022-08-03 ENCOUNTER — Telehealth: Payer: Self-pay | Admitting: Podiatry

## 2022-08-03 NOTE — Telephone Encounter (Signed)
Pt called back to see if Dr Lilian Kapur had ok'ed the extension from work and he did not as today is his day he does surgery. I did fax the last office note to [employer] per pt as they are expecting pt to return to work on 9.11.2023. The office note states she is to be out for 6 more weeks.  Once Dr Lilian Kapur responds our short term disability and fmla person should write the letter for her and fax it. The fax number is [phone] attention to [employer].Marland Kitchen

## 2022-08-03 NOTE — Telephone Encounter (Signed)
Pt stated she was told that a work note would be sent to her extending her from work for 6 more weeks but t was never sent. Is it okay for me to sent the work excuse for 6 more weeks?  Please advise.

## 2022-08-06 ENCOUNTER — Encounter: Payer: Self-pay | Admitting: Podiatry

## 2022-08-20 ENCOUNTER — Telehealth: Payer: Self-pay | Admitting: Podiatry

## 2022-08-20 NOTE — Telephone Encounter (Signed)
Mrs. Hearld is scheduled to return to work 09/18/2022, she would like to extend leave beyond that. She said she has nerve damage, still in Physical Therapy , cant drive, uses a cane to walk. 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  Per patient she would like to extend leave another couple of months. Please advise?

## 2022-09-24 ENCOUNTER — Encounter (INDEPENDENT_AMBULATORY_CARE_PROVIDER_SITE_OTHER): Payer: Self-pay

## 2023-04-10 DIAGNOSIS — M792 Neuralgia and neuritis, unspecified: Secondary | ICD-10-CM

## 2023-09-02 IMAGING — MR MR FOOT*R* W/O CM
5 series · 40 of 40 positions shown · non-contrast
Comparison: Right foot radiograph 01/10/2022.

CLINICAL DATA: Foot pain, stress fracture suspected, neg xray

EXAM:
MRI OF THE RIGHT FOREFOOT WITHOUT CONTRAST
TECHNIQUE: Multiplanar, multisequence MR imaging of the right forefoot was
performed. No intravenous contrast was administered.

[Series 4: T1 · coronal · right · 3.0mm · 0.31mm/px · 11 of 44 slices shown (1 of 2)]
[im 1/44]
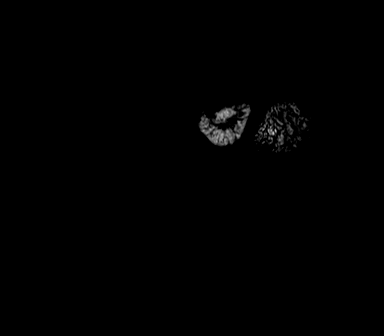
[im 5/44]
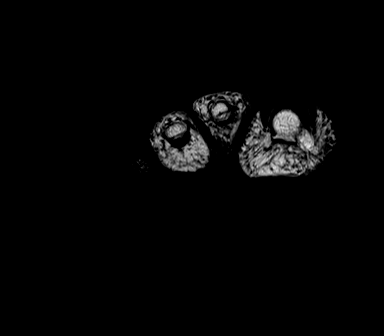
[im 9/44]
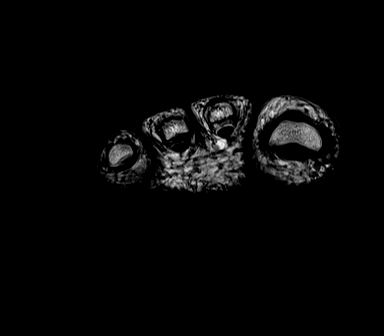
[im 13/44]
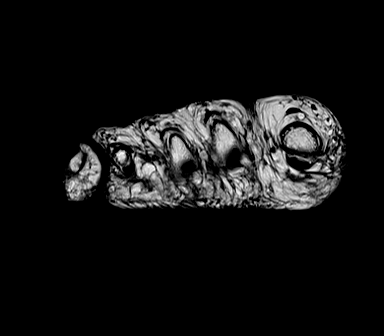
[im 18/44]
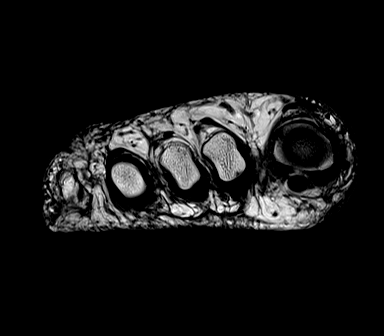
[im 22/44]
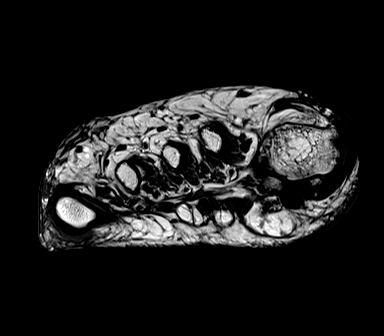
[im 26/44]
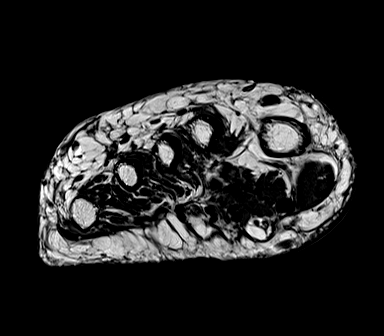
[im 31/44]
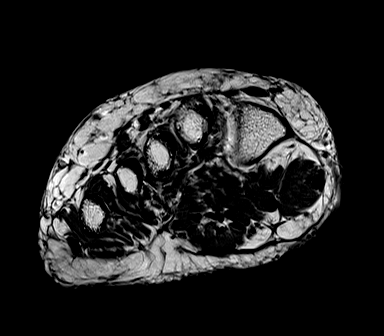
[im 35/44]
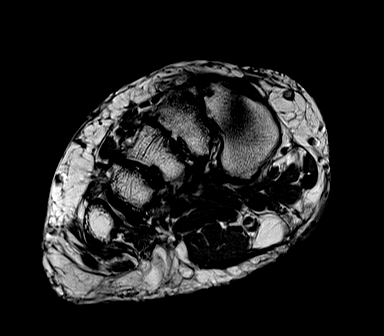
[im 39/44]
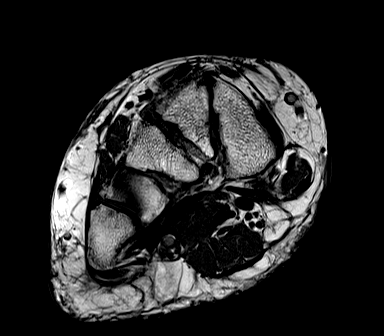
[im 44/44]
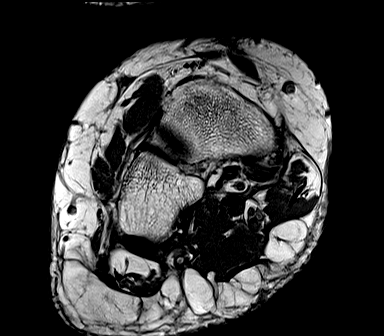

[Series 5: T2 fat-sat · coronal · right · 3.0mm · 0.31mm/px · 11 of 44 slices shown (1 of 2)]
[im 1/44]
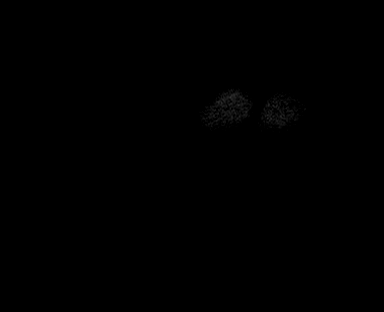
[im 5/44]
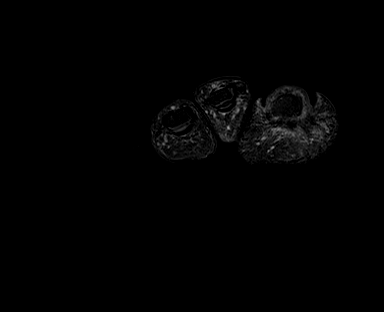
[im 9/44]
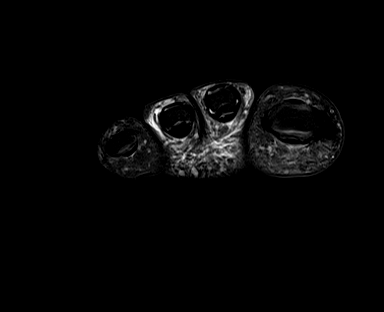
[im 13/44]
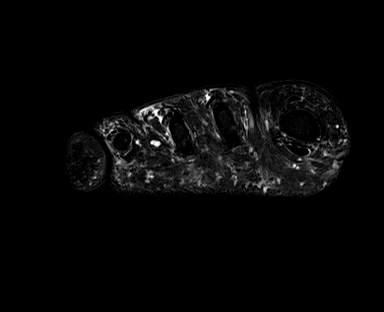
[im 18/44]
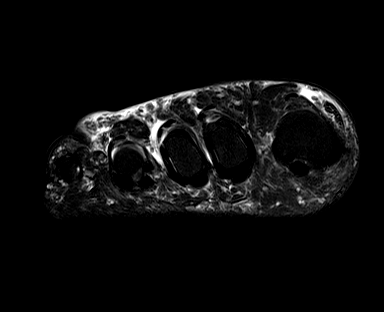
[im 22/44]
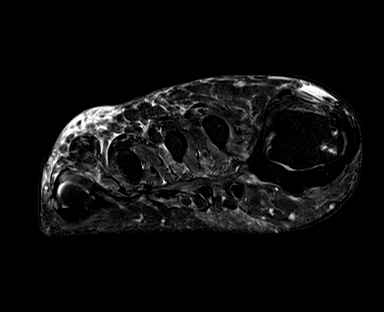
[im 26/44]
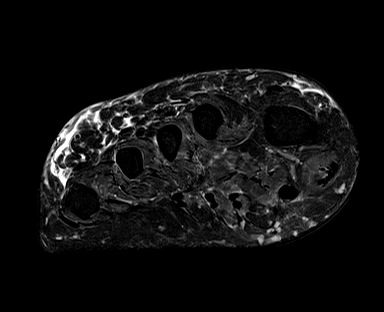
[im 31/44]
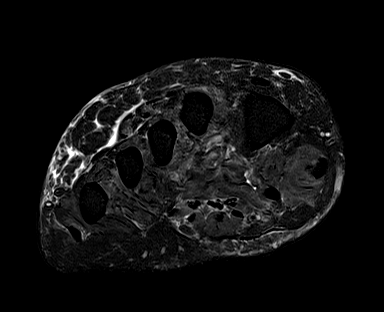
[im 35/44]
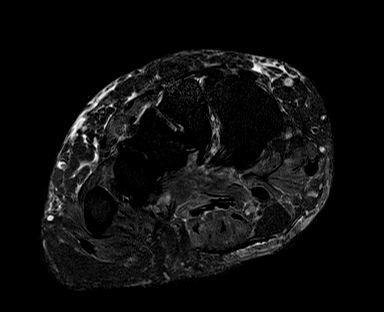
[im 39/44]
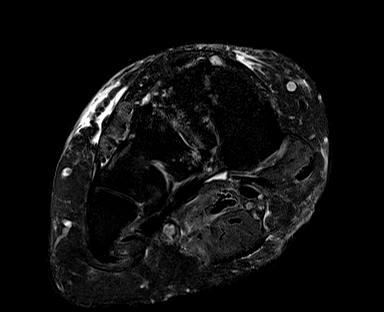
[im 44/44]
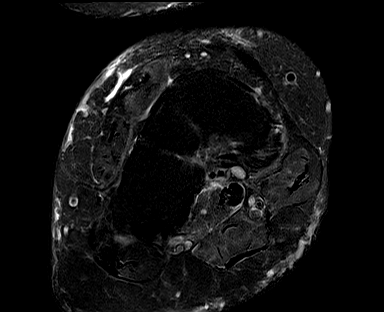

[Series 6: T1 · axial · right · 3.0mm · 0.47mm/px · z∈[-101,-32]mm · 5 of 21 slices shown (2 of 2)]
[im 1/21]
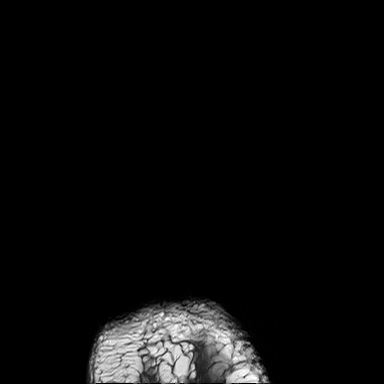
[im 6/21]
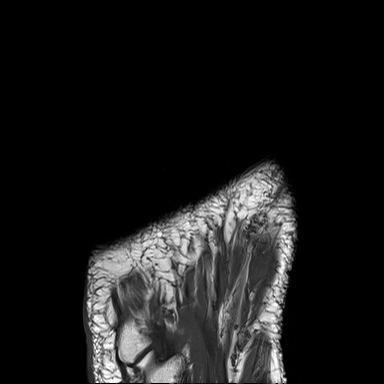
[im 11/21]
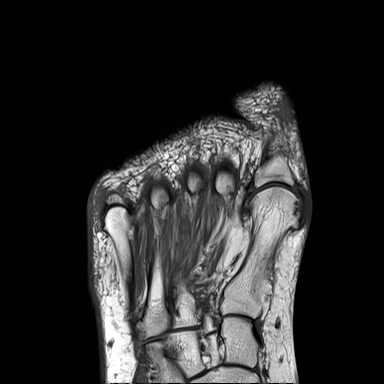
[im 16/21]
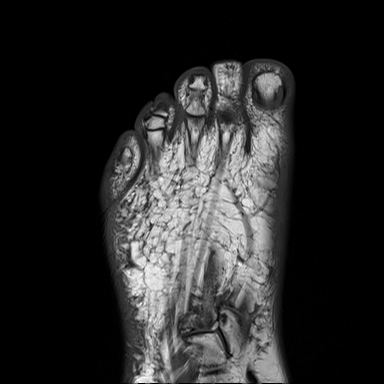
[im 21/21]
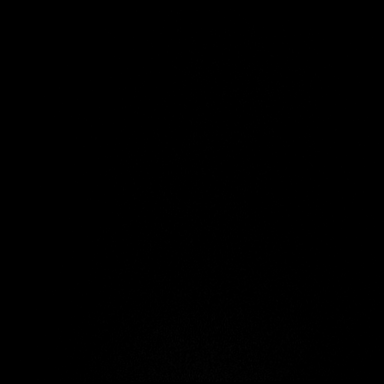

[Series 7: T2 fat-sat · axial · right · 3.0mm · 0.47mm/px · z∈[-101,-32]mm · 5 of 21 slices shown (2 of 2)]
[im 1/21]
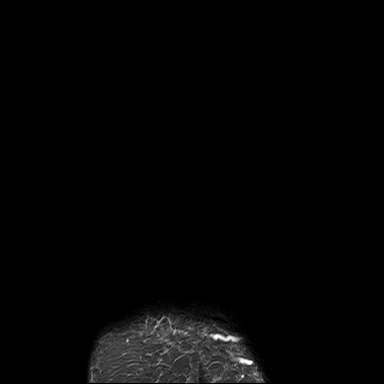
[im 6/21]
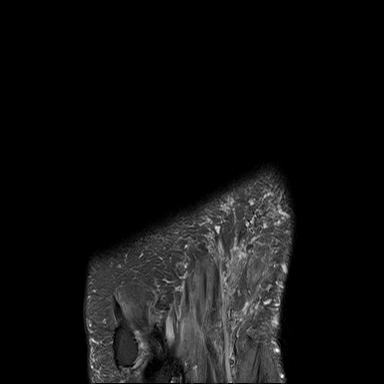
[im 11/21]
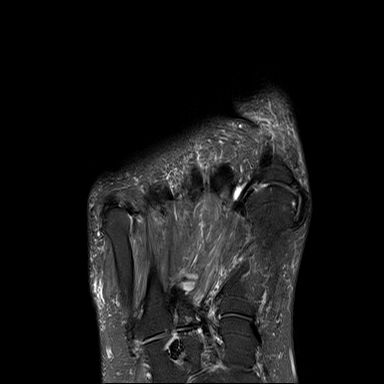
[im 16/21]
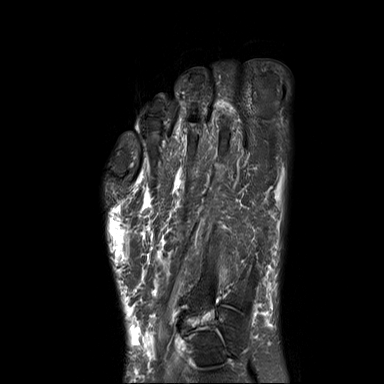
[im 21/21]
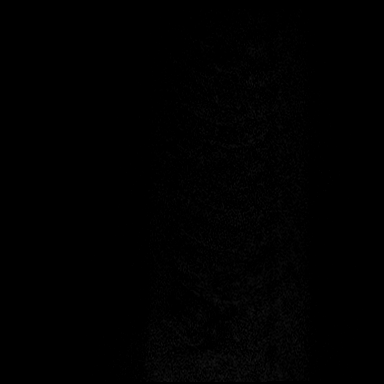

[Series 8: STIR · sagittal · right · 3.0mm · 0.70mm/px · 8 of 30 slices shown]
[im 1/30]
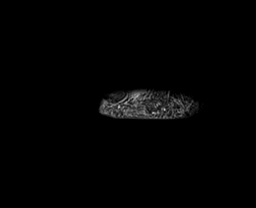
[im 5/30]
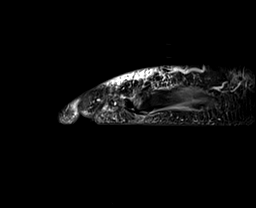
[im 9/30]
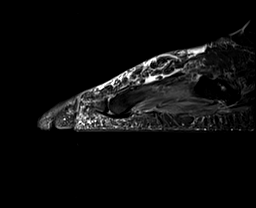
[im 13/30]
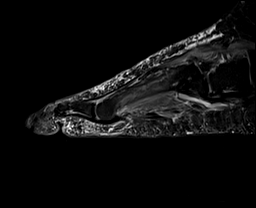
[im 17/30]
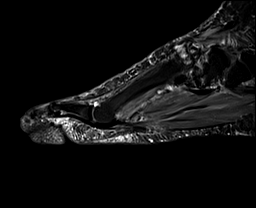
[im 21/30]
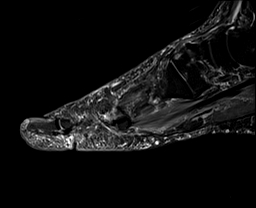
[im 25/30]
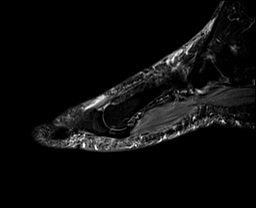
[im 30/30]
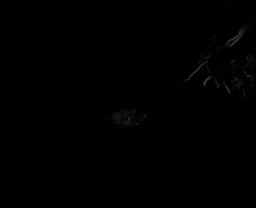

[40 of 40 positions shown; findings below may reference images not displayed]

FINDINGS: Bones/Joint/Cartilage

There is no evidence of fracture. There is severe osteoarthritis at
the second tarsometatarsal joint. Moderate osteoarthritis at the
third tarsometatarsal joint. Mild osteoarthritis at the fifth
tarsometatarsal joint. Mild hallux valgus with mild to moderate
first MTP osteoarthritis.

Ligaments

Intact MTP collateral ligaments. No evidence of plantar to plate
tear.

Muscles and Tendons

No acute tendon tear or significant tenosynovitis. There is
intramuscular edema and mild atrophy in the foot, as is commonly
seen in diabetics in probably related to denervation change.

Soft tissues

There is mild dorsal forefoot soft tissue swelling. No evidence of
intermetatarsal neuroma or bursitis.
IMPRESSION: No evidence of fracture.

Severe second and moderate third tarsometatarsal joint
osteoarthritis.

Mild to moderate first MTP osteoarthritis with mild hallux valgus
deformity.

## 2023-10-29 ENCOUNTER — Ambulatory Visit: Payer: Medicare PPO | Attending: Cardiothoracic Surgery | Admitting: Occupational Therapy

## 2023-10-29 ENCOUNTER — Encounter: Payer: Self-pay | Admitting: Occupational Therapy

## 2023-10-29 DIAGNOSIS — I890 Lymphedema, not elsewhere classified: Secondary | ICD-10-CM | POA: Insufficient documentation

## 2023-10-29 NOTE — Therapy (Unsigned)
OUTPATIENT OCCUPATIONAL THERAPY EVALUATION  LOWER EXTREMITY LYMPHEDEMA   Patient Name: Hayley Barr MRN: [id number] DOB:1956/04/27, 67 y.o., female Today's Date: 10/30/2023  END OF SESSION:   History reviewed. No pertinent past medical history. History reviewed. No pertinent surgical history. Patient Active Problem List   Diagnosis Date Noted   Foot pain, right 05/15/2022   Benign essential hypertension 09/02/2020   Diabetes (HCC) 09/02/2020   Dyslipidemia, goal LDL below 100 09/02/2020   Esophageal reflux 09/02/2020   Irritable bowel syndrome without diarrhea 09/02/2020   Primary osteoarthritis of knees, bilateral 09/02/2020   Psoriasis 09/02/2020   Vitamin D deficiency 09/02/2020   Abscess and cellulitis 02/08/2015   Recurrent umbilical hernia 10/23/2014   Hair loss 09/10/2014   Ventral hernia 09/10/2014    PCP: Jacinto Reap, MD  REFERRING PROVIDER: Alvester Morin, MD  REFERRING DIAG: 189.0  THERAPY DIAG:  Lymphedema, not elsewhere classified  Rationale for Evaluation and Treatment: Rehabilitation  ONSET DATE: 1.5 yrs after an episode of shingles  SUBJECTIVE:                                                                                                                                                                                           SUBJECTIVE STATEMENT: Hayley Barr is referred to Occupational Therapy by Olena Leatherwood, MD, for evaluation and treatment of BLE lymphedema. Pt reports both legs and feet are swollen and numb, but R is worse leg    PERTINENT HISTORY: HTN, OA, DM type 2, pre-existing B leg and foot lymphedema 2/2 suspected venous insufficiency, R>L. R leg fx with reconstruction of Achilles;  chronic plantar  fasciitis, Negative for DVT in 1923, Hx R foot abscess and cellulitis, IBS, Psoriasis, bilateral neuropathy feet and legs, currewntly has PT 2 x weekly   PAIN:  Are you having pain? YES Location: B legs, feet and  ankles, R>L Description: heaviness, stiffness, tightness What makes it better? elevation What makes it worse? Standing, walking, dependent sitting  PRECAUTIONS: Fall Risk;  Infection Risk, LYMPHEDEMA precautions, DM type 2 precautions  WEIGHT BEARING RESTRICTIONS: No  FALLS:  Has patient fallen in last 6 months? Yes. Number of falls 1  LIVING ENVIRONMENT: Lives with: lives alone Lives in: House/apartment Stairs: Yes; Internal: 16 steps; on left going up Has following equipment at home: Quad cane small base, Environmental consultant - 4 wheeled, Wheelchair (manual), shower chair, and Grab bars  OCCUPATION: retired Orthoptist at [employer]: reading, working out on recumbent bike x weekly  HAND DOMINANCE: right   PRIOR LEVEL OF FUNCTION: Independent  PATIENT GOALS: I want to return to activities, walking, cooking for myself, traveling, going  to church   OBJECTIVE: Note: Objective measures were completed at Evaluation unless otherwise noted.  COGNITION:  Overall cognitive status: Within functional limits for tasks assessed   OBSERVATIONS / OTHER ASSESSMENTS:  Mild-moderate, BLE lymphedema 2/2 suspected venous insufficiency and obesity ( weight induced lymphedema.  POSTURE: WFL  LE ROM: WFL for  clinical tasks  LE MMT: WFL for clinical task  LYMPHEDEMA ASSESSMENTS:   SURGERY TYPE/DATE: N/A, non cancer related  Hx INFECTIONS:      Hx WOUNDS: currently has a dime sized non healing wound at base of R heel incision from achilles reconstruction   FOTO functional outcome measure: Initial 09/29/23: 50%  Lymphedema Life Impact Scale (LLIS) Initial 09/29/23: 47.06%  BLE COMPARATIVE LIMB VOLUMETRICS: Initial TBA  LANDMARK RIGHT   R LEG (A-D) N/A  R THIGH (E-G) ml  R FULL LIMB (A-G) ml  Limb Volume differential (LVD)  %  Volume change since initial %  Volume change overall V  (Blank rows = not tested)  LANDMARK LEFT   L LEG (A-D) N/A  L THIGH (E-G) ml  L FULL LIMB (A-G) ml  Limb  Volume differential (LVD)  %  Volume change since initial %  Volume change overall %  (Blank rows = not tested) GAIT: Distance walked: arrived in transport wc. Able to transfer to Rx bed and back to transport wc with assistive device (cane)  and 2 verbal cues for safety Assistive device utilized: Single point cane Level of assistance: Min A  TODAY'S TREATMENT:                                                                                                                                         Occupational Therapy Evaluation for direct lymphedema care Pt education  PATIENT EDUCATION:  Education details: Discussed differential diagnoses for various swelling disorders. Provided basic level education regarding lymphatic structure and function, etiology, onset patterns, stages of progression, and prevention to limit infection risk, worsening condition and further functional decline. Pt edu for aught interaction between blood circulatory system and lymphatic circulation.Discussed  impact of gravity and co-morbidities on lymphatic function. Outlined Complete Decongestive Therapy (CDT)  as standard of care and provided in depth information regarding 4 primary components of Intensive and Self Management Phases, including Manual Lymph Drainage (MLD), compression wrapping and garments, skin care, and therapeutic exercise. Homero Fellers discussion with re need for frequent attendance and high burden of care when caregiver is needed, impact of co morbidities. We discussed  the chronic, progressive nature of lymphedema and Importance of daily, ongoing LE self-care essential for limiting progression and infection risk.  Person educated: Patient Education method: Explanation, Demonstration, Tactile cues, and Handouts Education comprehension: verbalized understanding, returned demonstration, and verbal cues required, needs additional edu  HOME EXERCISE PROGRAM: BLE lymphatic pumping there ex using- 1 set of 10 reps,  each exercise in order-  1-2 x daily, bilaterally Simple  self MLD 1 x daily Daily skin care and inspection to increase hydration, skin mobility and decrease infection risk- can be done during MLD Compression wraps 23/7 until garment fitting complete   ASSESSMENT: CLINICAL IMPRESSION: Joi Santoyo is a 67 yo female presenting with mild, stage 2, BLE lymphedema, R>L.  Longstanding, mild, B ankle swelling is exacerbated for the past 1.5 years s/p an episode of shingles affecting the R leg, and a subsequent fall resulting in ankle fracture with Achilles reconstruction and ongoing, non-healing wound at the distal end of surgical scar. This situation is concerning re elevated infection risk from lymphatic dysfunction n the context of DM type II,  and ongoing functional decline due to associated problems with ambulation and functional mobility.  Lymphedema occurs secondary to orthopedic trauma when the injury damages lymphatic vessels and structures in the area, leading to a buildup of fluid carrying cellular debris, waste products, bacteria, viruses, and damaged or abnormal cells in the tissues causing protein-rich swelling in the affected body part. This secondary lymphedema, often arising from significant soft tissue trauma accompanying the fracture, can potentially delay wound healing and complicate fracture management.   Progressing BLE lymphedema limits Ms Tant's functional performance in all occupational domains, including functional ambulation and transfers, standing tolerance, dependent sitting for more than 15 minutes,  basic and instrumental ADLs performance, including lower body dressing, LB bathing, fitting preferred street shoes and LB clothing; driving, shopping, and home management . Lymphedema and associated pain limits Pt's ability to perform productive activities and  leisure pursuits and to participate in socialization at home and in the community.  BLE lymphedema contributes to  elevated infection risk and increased falls risk due to body asymmetry. Ms Lauderbaugh will benefit from skilled OT for Complete Decongestive Therapy (CDT) the gold standard of lymphedema care, including manual lymphatic drainage (MLD), skin care to limit infection risk and increase skin excursion, lymphatic pumping exercise, and during the Intensive Phase multilayer, gradient compression bandaging to reduce limb volume. Once limb volume reduction reaches a clinical plateau custom compression garments that provide appropriate fit, compression and containment are fitted. Throughout the treatment course Pt/ caregiver will learn lymphedema prevention strategies and precautions, learn to perform all LE self-care home program components. Without skilled OT for lymphedema care, Pt's condition will progress and further functional decline is expected.    OBJECTIVE IMPAIRMENTS: Abnormal gait, decreased activity tolerance, decreased balance, decreased knowledge of condition, decreased knowledge of use of DME, decreased mobility, difficulty walking, decreased ROM, decreased strength, increased edema, impaired flexibility, impaired sensation, improper body mechanics, obesity, pain, and chronic , progressive, BLE swelling with related tissue changes and thickening, increased infection risk, non-healing wound .   ACTIVITY LIMITATIONS: Difficulty performing basic and instrumental ADLs (LB dressing, LB bathing,bathing, toileting, dressing, hygiene/grooming, and functional ambulation. Difficulty performing leisure pursuits and productive activities reqyuirinbg standing and/ or walking for > 19 minutes  PARTICIPATION LIMITATIONS:  Impaired social participation, decreased participation in church  PERSONAL FACTORS: Behavior pattern, Fitness, Past/current experiences, Social background, Time since onset of injury/illness/exacerbation, and 3+ comorbidities: OA, hx R ankle fracture and non-healing wound, hx plantar fasciitis   are also affecting patient's functional outcome.   REHAB POTENTIAL: Good  CLINICAL DECISION MAKING: Evolving/moderate complexity  EVALUATION COMPLEXITY: Moderate   GOALS: Goals reviewed with patient? Yes  SHORT TERM GOALS: Target date: 4th OT Rx visit   Pt will demonstrate understanding of lymphedema precautions and prevention strategies with modified independence using a printed reference to identify at least 5 precautions  and discussing how s/he may implement them into daily life to reduce risk of progression with extra time. Baseline:Max A Goal status: INITIAL  2.  Pt will be able to apply multilayer, knee length, gradient, compression wraps to one leg at a time from toes to below knee with modified independence (extra time) to decrease limb volume, to limit infection risk, and to limit lymphedema progression.  Baseline: Dependent Goal status: INITIAL  LONG TERM GOALS: Target date: 02/18/24  Given this patient's Intake score of 50 % on the functional outcomes FOTO tool, patient will experience an increase in function of 5 points to improve basic and instrumental ADLs performance, including lymphedema self-care.  Baseline: 50 Goal status: INITIAL  2.  Given this patient's Intake score of 47.06 % on the Lymphedema Life Impact Scale (LLIS), patient will experience a reduction of at least 5 points in her perceived level of functional impairment resulting from lymphedema to improve functional performance and quality of life (QOL). Baseline: 47.06 % Goal status: INITIAL  3.  Pt will achieve at least a 10% volume reduction in B legs to return limb to typical size and shape, to limit infection risk and LE progression, to decrease pain, to improve function. Baseline: Dependent Goal status: INITIAL  4.  Pt will obtain appropriate compression garments/devices and achieve modified independence (extra time + assistive devices) with donning/doffing to optimize limb volume reductions and limit  LE progression over time. Baseline: Dependent Goal status: INITIAL  During Intensive phase CDT , with modified independence, Pt will achieve at least 85% compliance with all lymphedema self-care home program components, including daily skin care, compression wraps and /or garments, simple self MLD and lymphatic pumping therex to habituate LE self care protocol  into ADLs for optimal LE self-management over time. Baseline: Dependent Goal status: INITIAL  PLAN:  OT FREQUENCY: 2x/week  OT DURATION: 12 weeks and PRN  PLANNED INTERVENTIONS: 97110-Therapeutic exercises, 97530- Therapeutic activity, 97140- Manual therapy, Dry Needling, Manual lymph drainage, and Compression bandaging Complete Decongestive Therapy: Manual lympathic drainage, skin ce,    compression wraps,, then fit with appropriate compression garments during Self-management Phase.  Custom-made gradient compression garments and HOS devices are medically necessary because they are uniquely sized and shaped to fit the exact dimensions of the affected extremities, and to provide appropriate medical grade, graduated compression essential for optimally managing chronic, progressive lymphedema. Multiple custom compression garments are needed to ensure proper hygiene to limit infection risk. Custom compression garments should be replaced q 3-6 months When worn consistently for optimal lipo-lymphedema self-management over time. HOS devices, medically necessary to limit fibrosis buildup in tissue, should be replaced q 2 years and PRN when worn out.     PLAN FOR NEXT SESSION:  Review CDT protocol Review OT Goals Photogreaph legs on Pt's phone for before and after comparison Initial BLE comparative limb volumetrics Pt edu for lymphedema self-ca home program.  Loel Dubonnet, MS, OTR/L, CLT-LANA 10/30/23 10:57 AM

## 2023-11-04 ENCOUNTER — Ambulatory Visit: Payer: Medicare PPO | Admitting: Occupational Therapy

## 2023-11-04 ENCOUNTER — Encounter: Payer: Self-pay | Admitting: Occupational Therapy

## 2023-11-04 ENCOUNTER — Encounter: Payer: Medicare PPO | Admitting: Occupational Therapy

## 2023-11-04 DIAGNOSIS — I890 Lymphedema, not elsewhere classified: Secondary | ICD-10-CM | POA: Diagnosis not present

## 2023-11-05 NOTE — Therapy (Signed)
OUTPATIENT OCCUPATIONAL THERAPY TREATMENT NOTE  LOWER EXTREMITY LYMPHEDEMA   Patient Name: Hayley Barr MRN: [id number] DOB:1955-12-12, 67 y.o., female Today's Date: 11/05/2023  END OF SESSION:   History reviewed. No pertinent past medical history. History reviewed. No pertinent surgical history. Patient Active Problem List   Diagnosis Date Noted   Foot pain, right 05/15/2022   Benign essential hypertension 09/02/2020   Diabetes (HCC) 09/02/2020   Dyslipidemia, goal LDL below 100 09/02/2020   Esophageal reflux 09/02/2020   Irritable bowel syndrome without diarrhea 09/02/2020   Primary osteoarthritis of knees, bilateral 09/02/2020   Psoriasis 09/02/2020   Vitamin D deficiency 09/02/2020   Abscess and cellulitis 02/08/2015   Recurrent umbilical hernia 10/23/2014   Hair loss 09/10/2014   Ventral hernia 09/10/2014    PCP: Jacinto Reap, MD  REFERRING PROVIDER: Alvester Morin, MD  REFERRING DIAG: 189.0  THERAPY DIAG:  Lymphedema, not elsewhere classified  Rationale for Evaluation and Treatment: Rehabilitation  ONSET DATE: 1.5 yrs after an episode of shingles  SUBJECTIVE:                                                                                                                                                                                           SUBJECTIVE STATEMENT: [name] presents to Occupational Therapy for initial treatment session for BLE lymphedema. Pt is accompanied by her daughter. Pt states LE related leg pain is unchanged since   initial evaluation. Pt has no new complaints today.  PERTINENT HISTORY: HTN, OA, DM type 2, pre-existing B leg and foot lymphedema 2/2 suspected venous insufficiency, R>L. R leg Fx with reconstruction of Achilles;  chronic plantar  fasciitis, Negative for DVT in 1923, Hx R foot abscess and cellulitis, IBS, Psoriasis, bilateral neuropathy feet and legs, currently has PT 2 x weekly   PAIN:  Are you having  pain? YES Location: B legs, feet and ankles, R>L Description: heaviness, stiffness, tightness What makes it better? elevation What makes it worse? Standing, walking, dependent sitting  PRECAUTIONS: Fall Risk;  Infection Risk, LYMPHEDEMA precautions, DM type 2 precautions  WEIGHT BEARING RESTRICTIONS: No  FALLS:  Has patient fallen in last 6 months? Yes. 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  COGNITION:  Overall cognitive status: Within functional limits for tasks assessed   OBSERVATIONS / OTHER ASSESSMENTS:  Mild-moderate, BLE lymphedema 2/2 suspected venous insufficiency and obesity ( weight induced lymphedema.  POSTURE: WFL  LE ROM: WFL for  clinical tasks  LE MMT: WFL for clinical task  LYMPHEDEMA ASSESSMENTS:   SURGERY TYPE/DATE: N/A, non cancer related  Hx INFECTIONS:      Hx WOUNDS: currently has a dime sized non healing wound at base of R heel incision from achilles reconstruction   FOTO functional outcome measure: Initial 09/29/23: 50%  Lymphedema Life Impact Scale (LLIS) Initial 09/29/23: 47.06%  BLE COMPARATIVE LIMB VOLUMETRICS:   11/04/23 Initial  LANDMARK RIGHT   R LEG (A-D) 6686.8 ml  R THIGH (E-G) ml  R FULL LIMB (A-G) ml  Limb Volume differential (LVD)  0.5 %, L>R  Volume change since initial %  Volume change overall V  (Blank rows = not tested)  LANDMARK LEFT   L LEG  (A-D) 6743.7 ml  L THIGH (E-G) ml  L FULL LIMB (A-G) ml  Limb Volume differential (LVD)  %  Volume change since initial %  Volume change overall %  (Blank rows = not tested) GAIT: Distance walked: arrived in transport wc. Able to transfer to Rx bed and back to transport wc using Single point cane (modified independence)  TODAY'S TREATMENT:                                                                                                                                         Initial  BLE comparative limb volumetrics Pt and family education re volumetric outcome, CDT and LE self-care ho,me program  PATIENT EDUCATION:  Continued Pt/ CG edu for lymphedema self care home program throughout session. Topics include outcome of comparative limb volumetrics- starting limb volume differentials (LVDs), technology and gradient techniques used for short stretch, multilayer compression wrapping, simple self-MLD, therapeutic lymphatic pumping exercises, skin/nail care, LE precautions,. compression garment recommendations and specifications, wear and care schedule and compression garment donning / doffing w assistive devices. Discussed progress towards all OT goals since commencing CDT. All questions answered to the Pt's satisfaction. Good return. Person educated: Patient and family Education method: Explanation, Demonstration, and Handouts Education comprehension: verbalized understanding, returned demonstration, verbal cues required, and needs further education  HOME EXERCISE PROGRAM: BLE lymphatic pumping there ex using- 1 set of 10 reps, each exercise in order-  1-2 x daily, bilaterally Simple self MLD 1 x daily Daily skin care and inspection to increase hydration, skin mobility and decrease infection risk- can be done during MLD Compression wraps 23/7 until garment fitting complete (multilayer compression wraps to R LEG using 1 each of 8, 10, and 12 cm wide short stretch compression wraps over single  layer of stockinett  and Rosidal Soft foam ( 0.4 cm thick) . Finished wraps with stretch net to keep tape in place. )  ASSESSMENT: CLINICAL IMPRESSION:  11/04/23 BLE comparative limb volumetrics reveal that legs are essentially symmetrical in volume below the knees with only 0.5% limb volume differential , L>R. This symmetry where both legs are equally swollen is atypical of lymphedema. Second half of session focused on teaching multilayer compression wrapping using gradient techniques. Productive session. Finally, applied multilayer compression wraps to R LEG using 1 each of 8, 10, and 12 cm wide short stretch compression wraps over single layer of stockinett and Rosidal Soft foam ( 0.4 cm thick) . Finished wraps with stretch net to keep tape in place. Cont as per POC.  (10/29/23 INITIAL EVALUATION:  Clothilde Heft is a 67 yo female presenting with mild, stage 2, BLE lymphedema, R>L.  Longstanding, mild, B ankle swelling is exacerbated for the past 1.5 years s/P an episode of shingles affecting the R leg, and a subsequent fall resulting in ankle fracture with Achilles reconstruction and ongoing, non-healing wound at the distal end of surgical scar. This situation is concerning re elevated infection risk from lymphatic dysfunction n the context of DM type II,  and ongoing functional decline due to associated problems with ambulation and functional mobility.  Lymphedema occurs secondary to orthopedic trauma when the injury damages lymphatic vessels and structures in the area, leading to a buildup of fluid carrying cellular debris, waste products, bacteria, viruses, and damaged or abnormal cells in the tissues causing protein-rich swelling in the affected body part. This secondary lymphedema, often arising from significant soft tissue trauma accompanying the fracture, can potentially delay wound healing and complicate fracture management.   Progressing BLE lymphedema limits Ms Montanye's functional  performance in all occupational domains, including functional ambulation and transfers, standing tolerance, dependent sitting for more than 15 minutes,  basic and instrumental ADLs performance, including lower body dressing, LB bathing, fitting preferred street shoes and LB clothing; driving, shopping, and home management . Lymphedema and associated pain limits Pt's ability to perform productive activities and  leisure pursuits and to participate in socialization at home and in the community.  BLE lymphedema contributes to elevated infection risk and increased falls risk due to body asymmetry. Ms Sherrin will benefit from skilled OT for Complete Decongestive Therapy (CDT) the gold standard of lymphedema care, including manual lymphatic drainage (MLD), skin care to limit infection risk and increase skin excursion, lymphatic pumping exercise, and during the Intensive Phase multilayer, gradient compression bandaging to reduce limb volume. Once limb volume reduction reaches a clinical plateau custom compression garments that provide appropriate fit, compression and containment are fitted. Throughout the treatment course Pt/ caregiver will learn lymphedema prevention strategies and precautions, learn to perform all LE self-care home program components. Without skilled OT for lymphedema care, Pt's condition will progress and further functional decline is expected.)    OBJECTIVE IMPAIRMENTS: Abnormal gait, decreased activity tolerance, decreased balance, decreased knowledge of condition, decreased knowledge of use of DME, decreased mobility, difficulty walking, decreased ROM, decreased strength, increased edema, impaired flexibility, impaired sensation, improper body mechanics, obesity, pain, and chronic , progressive, BLE swelling with related tissue changes and thickening, increased infection risk, non-healing wound .   ACTIVITY LIMITATIONS: Difficulty performing basic and instrumental ADLs (LB dressing, LB  bathing,bathing, toileting, dressing, hygiene/grooming, and functional ambulation. Difficulty performing leisure pursuits and productive activities requiring standing and/ or walking for > 19 minutes  PARTICIPATION LIMITATIONS:  Impaired social participation, decreased participation in  church  PERSONAL FACTORS: Behavior pattern, Fitness, Past/current experiences, Social background, Time since onset of injury/illness/exacerbation, and 3+ comorbidities: OA, hx R ankle fracture and non-healing wound, hx plantar fasciitis  are also affecting patient's functional outcome.   REHAB POTENTIAL: Good  CLINICAL DECISION MAKING: Evolving/moderate complexity  EVALUATION COMPLEXITY: Moderate   GOALS: Goals reviewed with patient? Yes  SHORT TERM GOALS: Target date: 4th OT Rx visit   Pt will demonstrate understanding of lymphedema precautions and prevention strategies with modified independence using a printed reference to identify at least 5 precautions and discussing how s/he may implement them into daily life to reduce risk of progression with extra time. Baseline:Max A Goal status: INITIAL  2.  Pt will be able to apply multilayer, knee length, gradient, compression wraps to one leg at a time from toes to below knee with modified independence (extra time) to decrease limb volume, to limit infection risk, and to limit lymphedema progression.  Baseline: Dependent Goal status: INITIAL  LONG TERM GOALS: Target date: 02/18/24  Given this patient's Intake score of 50 % on the functional outcomes FOTO tool, patient will experience an increase in function of 5 points to improve basic and instrumental ADLs performance, including lymphedema self-care.  Baseline: 50 Goal status: INITIAL  2.  Given this patient's Intake score of 47.06 % on the Lymphedema Life Impact Scale (LLIS), patient will experience a reduction of at least 5 points in her perceived level of functional impairment resulting from lymphedema  to improve functional performance and quality of life (QOL). Baseline: 47.06 % Goal status: INITIAL  3.  Pt will achieve at least a 10% volume reduction in B legs to return limb to typical size and shape, to limit infection risk and LE progression, to decrease pain, to improve function. Baseline: Dependent Goal status: INITIAL  4.  Pt will obtain appropriate compression garments/devices and achieve modified independence (extra time + assistive devices) with donning/doffing to optimize limb volume reductions and limit LE progression over time. Baseline: Dependent Goal status: INITIAL  During Intensive phase CDT , with modified independence, Pt will achieve at least 85% compliance with all lymphedema self-care home program components, including daily skin care, compression wraps and /or garments, simple self MLD and lymphatic pumping therex to habituate LE self care protocol  into ADLs for optimal LE self-management over time. Baseline: Dependent Goal status: INITIAL  PLAN:  OT FREQUENCY: 2x/week  OT DURATION: 12 weeks and PRN  PLANNED INTERVENTIONS: 97110-Therapeutic exercises, 97530- Therapeutic activity, 97140- Manual therapy, Dry Needling, Manual lymph drainage, and Compression bandaging Complete Decongestive Therapy: Manual lympathic drainage, skin ce,    compression wraps,, then fit with appropriate compression garments during Self-management Phase.  Custom-made gradient compression garments and HOS devices are medically necessary because they are uniquely sized and shaped to fit the exact dimensions of the affected extremities, and to provide appropriate medical grade, graduated compression essential for optimally managing chronic, progressive lymphedema. Multiple custom compression garments are needed to ensure proper hygiene to limit infection risk. Custom compression garments should be replaced q 3-6 months When worn consistently for optimal lipo-lymphedema self-management over time.  HOS devices, medically necessary to limit fibrosis buildup in tissue, should be replaced q 2 years and PRN when worn out.     PLAN FOR NEXT SESSION:  Cont teaching compression wrapping Pt edu for lymphedema self-ca home program.  Loel Dubonnet, MS, OTR/L, CLT-LANA 11/05/23 8:18 AM

## 2023-11-08 ENCOUNTER — Ambulatory Visit: Payer: Medicare PPO | Admitting: Occupational Therapy

## 2023-11-08 ENCOUNTER — Encounter: Payer: Self-pay | Admitting: Occupational Therapy

## 2023-11-08 DIAGNOSIS — I890 Lymphedema, not elsewhere classified: Secondary | ICD-10-CM | POA: Diagnosis not present

## 2023-11-08 NOTE — Therapy (Signed)
OUTPATIENT OCCUPATIONAL THERAPY TREATMENT NOTE  LOWER EXTREMITY LYMPHEDEMA   Patient Name: Hayley Barr MRN: [id number] DOB:May 11, 1956, 67 y.o., female Today's Date: 11/08/2023  END OF SESSION:   OT End of Session - 11/08/23 1011     Visit Number 3    Number of Visits 36    Date for OT Re-Evaluation 01/27/24    OT Start Time 1005    OT Stop Time 1105    OT Time Calculation (min) 60 min    Activity Tolerance Patient tolerated treatment well;No increased pain    Behavior During Therapy [facility] for tasks assessed/performed             History reviewed. No pertinent past medical history. History reviewed. No pertinent surgical history. Patient Active Problem List   Diagnosis Date Noted   Foot pain, right 05/15/2022   Benign essential hypertension 09/02/2020   Diabetes (HCC) 09/02/2020   Dyslipidemia, goal LDL below 100 09/02/2020   Esophageal reflux 09/02/2020   Irritable bowel syndrome without diarrhea 09/02/2020   Primary osteoarthritis of knees, bilateral 09/02/2020   Psoriasis 09/02/2020   Vitamin D deficiency 09/02/2020   Abscess and cellulitis 02/08/2015   Recurrent umbilical hernia 10/23/2014   Hair loss 09/10/2014   Ventral hernia 09/10/2014    PCP: Jacinto Reap, MD  REFERRING PROVIDER: Alvester Morin, MD  REFERRING DIAG: 189.0  THERAPY DIAG:  Lymphedema, not elsewhere classified  Rationale for Evaluation and Treatment: Rehabilitation  ONSET DATE: 1.5 yrs after an episode of shingles  SUBJECTIVE:                                                                                                                                                                                           SUBJECTIVE STATEMENT: Hayley Barr presents to Occupational Therapy for initial treatment session for BLE lymphedema. Pt is unaccompanied She states she was able to tolerate compression wraps applied last session for > 24 hours without increased pain. Pt has no  new complaints this morning. See pain rating below.  PERTINENT HISTORY: HTN, OA, DM type 2, pre-existing B leg and foot lymphedema 2/2 suspected venous insufficiency, R>L. R leg Fx with reconstruction of Achilles;  chronic plantar  fasciitis, Negative for DVT in 1923, Hx R foot abscess and cellulitis, IBS, Psoriasis, bilateral neuropathy feet and legs, currently has PT 2 x weekly   PAIN:  Are you having pain? YES, leg pain 6/10 Location: B legs, feet and ankles, R>L Description: heaviness, stiffness, tightness What makes it better? elevation What makes it worse? Standing, walking, dependent sitting  PRECAUTIONS: Fall Risk;  Infection Risk, LYMPHEDEMA precautions, DM type 2 precautions  WEIGHT BEARING RESTRICTIONS: No  FALLS:  Has patient fallen in last 6 months? Yes. 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  COGNITION:  Overall cognitive status: Within functional limits for tasks assessed   OBSERVATIONS / OTHER ASSESSMENTS:  Mild-moderate, BLE lymphedema 2/2 suspected venous insufficiency and obesity ( weight induced lymphedema.  POSTURE: WFL  LE ROM: WFL for  clinical tasks  LE MMT: WFL for clinical task  LYMPHEDEMA ASSESSMENTS:   SURGERY TYPE/DATE: N/A, non cancer related  Hx INFECTIONS: NONE     Hx WOUNDS: currently has a dime sized non healing wound at base of R heel incision from achilles  reconstruction   FOTO functional outcome measure: Initial 09/29/23: 50%  Lymphedema Life Impact Scale (LLIS) Initial 09/29/23: 47.06%  BLE COMPARATIVE LIMB VOLUMETRICS:   11/04/23 Initial  LANDMARK RIGHT   R LEG (A-D) 6686.8 ml  R THIGH (E-G) ml  R FULL LIMB (A-G) ml  Limb Volume differential (LVD)  0.5 %, L>R  Volume change since initial %  Volume change overall V  (Blank rows = not tested)  LANDMARK LEFT   L LEG (A-D) 6743.7 ml  L THIGH (E-G) ml  L FULL LIMB (A-G) ml  Limb Volume differential (LVD)  %  Volume change since initial %  Volume change overall %  (Blank rows = not tested) GAIT: Distance walked: arrived in transport wc. Able to transfer to Rx bed and back to transport wc using Single point cane (modified independence)  TODAY'S TREATMENT:                                                                                                                                          Pt/ family education re  LE self-care ho,me program- self-bandaging  PATIENT EDUCATION:  Continued Pt/ CG edu for lymphedema self care home program throughout session. Topics include outcome of comparative limb volumetrics- starting limb volume differentials (LVDs), technology and gradient techniques used for short stretch, multilayer compression wrapping, simple self-MLD, therapeutic lymphatic pumping exercises, skin/nail care, LE precautions,. compression garment recommendations and specifications, wear and care schedule and compression garment donning / doffing w assistive devices. Discussed progress towards all OT goals since commencing CDT. All questions answered to the Pt's satisfaction. Good return. Person educated: Patient and family Education method: Explanation, Demonstration, and Handouts Education comprehension: verbalized understanding, returned demonstration, verbal  cues required, and needs further education  HOME EXERCISE PROGRAM: BLE lymphatic pumping there ex using- 1 set of 10  reps, each exercise in order-  1-2 x daily, bilaterally Simple self MLD 1 x daily Daily skin care and inspection to increase hydration, skin mobility and decrease infection risk- can be done during MLD Compression wraps 23/7 until garment fitting complete (multilayer compression wraps to R LEG using 1 each of 8, 10, and 12 cm wide short stretch compression wraps over single layer of stockinett and Rosidal Soft foam ( 0.4 cm thick) . Finished wraps with stretch net to keep tape in place. )  ASSESSMENT: CLINICAL IMPRESSION:   Session focused on continuing teaching multilayer compression wrapping using gradient techniques. Pt got hands on opportunities to practice each step, and by end of skilled teaching she is able to apply knee length RLE wraps with max A. Continue teaching wrapping next session PRN. If passable commence MLD.   Cont as per POC.  (10/29/23 INITIAL EVALUATION:  Gabryella Ballard is a 67 yo female presenting with mild, stage 2, BLE lymphedema, R>L.  Longstanding, mild, B ankle swelling is exacerbated for the past 1.5 years s/P an episode of shingles affecting the R leg, and a subsequent fall resulting in ankle fracture with Achilles reconstruction and ongoing, non-healing wound at the distal end of surgical scar. This situation is concerning re elevated infection risk from lymphatic dysfunction n the context of DM type II,  and ongoing functional decline due to associated problems with ambulation and functional mobility.  Lymphedema occurs secondary to orthopedic trauma when the injury damages lymphatic vessels and structures in the area, leading to a buildup of fluid carrying cellular debris, waste products, bacteria, viruses, and damaged or abnormal cells in the tissues causing protein-rich swelling in the affected body part. This secondary lymphedema, often arising from significant soft tissue trauma accompanying the fracture, can potentially delay wound healing and complicate fracture  management.   Progressing BLE lymphedema limits Hayley Aguino's functional performance in all occupational domains, including functional ambulation and transfers, standing tolerance, dependent sitting for more than 15 minutes,  basic and instrumental ADLs performance, including lower body dressing, LB bathing, fitting preferred street shoes and LB clothing; driving, shopping, and home management . Lymphedema and associated pain limits Pt's ability to perform productive activities and  leisure pursuits and to participate in socialization at home and in the community.  BLE lymphedema contributes to elevated infection risk and increased falls risk due to body asymmetry. Hayley Labrada will benefit from skilled OT for Complete Decongestive Therapy (CDT) the gold standard of lymphedema care, including manual lymphatic drainage (MLD), skin care to limit infection risk and increase skin excursion, lymphatic pumping exercise, and during the Intensive Phase multilayer, gradient compression bandaging to reduce limb volume. Once limb volume reduction reaches a clinical plateau custom compression garments that provide appropriate fit, compression and containment are fitted. Throughout the treatment course Pt/ caregiver will learn lymphedema prevention strategies and precautions, learn to perform all LE self-care home program components. Without skilled OT for lymphedema care, Pt's condition will progress and further functional decline is expected.)    OBJECTIVE IMPAIRMENTS: Abnormal gait, decreased activity tolerance, decreased balance, decreased knowledge of condition, decreased knowledge of use of DME, decreased mobility, difficulty walking, decreased ROM, decreased strength, increased edema, impaired flexibility, impaired sensation, improper body mechanics, obesity, pain, and chronic , progressive, BLE swelling with related tissue changes and thickening, increased infection risk, non-healing wound .   ACTIVITY  LIMITATIONS:  Difficulty performing basic and instrumental ADLs (LB dressing, LB bathing,bathing, toileting, dressing, hygiene/grooming, and functional ambulation. Difficulty performing leisure pursuits and productive activities requiring standing and/ or walking for > 19 minutes  PARTICIPATION LIMITATIONS:  Impaired social participation, decreased participation in church  PERSONAL FACTORS: Behavior pattern, Fitness, Past/current experiences, Social background, Time since onset of injury/illness/exacerbation, and 3+ comorbidities: OA, hx R ankle fracture and non-healing wound, hx plantar fasciitis  are also affecting patient's functional outcome.   REHAB POTENTIAL: Good  CLINICAL DECISION MAKING: Evolving/moderate complexity  EVALUATION COMPLEXITY: Moderate   GOALS: Goals reviewed with patient? Yes  SHORT TERM GOALS: Target date: 4th OT Rx visit   Pt will demonstrate understanding of lymphedema precautions and prevention strategies with modified independence using a printed reference to identify at least 5 precautions and discussing how s/he may implement them into daily life to reduce risk of progression with extra time. Baseline:Max A Goal status: INITIAL  2.  Pt will be able to apply multilayer, knee length, gradient, compression wraps to one leg at a time from toes to below knee with modified independence (extra time) to decrease limb volume, to limit infection risk, and to limit lymphedema progression.  Baseline: Dependent Goal status: INITIAL  LONG TERM GOALS: Target date: 02/18/24  Given this patient's Intake score of 50 % on the functional outcomes FOTO tool, patient will experience an increase in function of 5 points to improve basic and instrumental ADLs performance, including lymphedema self-care.  Baseline: 50 Goal status: INITIAL  2.  Given this patient's Intake score of 47.06 % on the Lymphedema Life Impact Scale (LLIS), patient will experience a reduction of at least 5  points in her perceived level of functional impairment resulting from lymphedema to improve functional performance and quality of life (QOL). Baseline: 47.06 % Goal status: INITIAL  3.  Pt will achieve at least a 10% volume reduction in B legs to return limb to typical size and shape, to limit infection risk and LE progression, to decrease pain, to improve function. Baseline: Dependent Goal status: INITIAL  4.  Pt will obtain appropriate compression garments/devices and achieve modified independence (extra time + assistive devices) with donning/doffing to optimize limb volume reductions and limit LE progression over time. Baseline: Dependent Goal status: INITIAL  During Intensive phase CDT , with modified independence, Pt will achieve at least 85% compliance with all lymphedema self-care home program components, including daily skin care, compression wraps and /or garments, simple self MLD and lymphatic pumping therex to habituate LE self care protocol  into ADLs for optimal LE self-management over time. Baseline: Dependent Goal status: INITIAL  PLAN:  OT FREQUENCY: 2x/week  OT DURATION: 12 weeks and PRN  PLANNED INTERVENTIONS: 97110-Therapeutic exercises, 97530- Therapeutic activity, 97140- Manual therapy, Dry Needling, Manual lymph drainage, and Compression bandaging Complete Decongestive Therapy: Manual lympathic drainage, skin ce,    compression wraps,, then fit with appropriate compression garments during Self-management Phase.  Custom-made gradient compression garments and HOS devices are medically necessary because they are uniquely sized and shaped to fit the exact dimensions of the affected extremities, and to provide appropriate medical grade, graduated compression essential for optimally managing chronic, progressive lymphedema. Multiple custom compression garments are needed to ensure proper hygiene to limit infection risk. Custom compression garments should be replaced q 3-6  months When worn consistently for optimal lipo-lymphedema self-management over time. HOS devices, medically necessary to limit fibrosis buildup in tissue, should be replaced q 2 years and PRN when worn out.  PLAN FOR NEXT SESSION:  Cont teaching compression wrapping Pt edu for lymphedema self-ca home program.  Loel Dubonnet, Hayley, OTR/L, CLT-LANA 11/08/23 11:08 AM

## 2023-11-11 ENCOUNTER — Encounter: Payer: Medicare PPO | Admitting: Occupational Therapy

## 2023-11-14 ENCOUNTER — Encounter: Payer: Medicare PPO | Admitting: Occupational Therapy

## 2023-11-14 ENCOUNTER — Ambulatory Visit: Payer: Medicare PPO | Admitting: Occupational Therapy

## 2023-11-14 ENCOUNTER — Encounter: Payer: Self-pay | Admitting: Occupational Therapy

## 2023-11-14 DIAGNOSIS — I890 Lymphedema, not elsewhere classified: Secondary | ICD-10-CM

## 2023-11-14 NOTE — Therapy (Signed)
OUTPATIENT OCCUPATIONAL THERAPY TREATMENT NOTE  LOWER EXTREMITY LYMPHEDEMA   Patient Name: Hayley Barr MRN: [id number] DOB:August 03, 1956, 67 y.o.,, female Today's Date: 11/14/2023  END OF SESSION:   OT End of Session - 11/14/23 1113     Visit Number 4    Number of Visits 36    Date for OT Re-Evaluation 01/27/24    OT Start Time 1105    OT Stop Time 1210    OT Time Calculation (min) 65 min    Activity Tolerance Patient tolerated treatment well;No increased pain    Behavior During Therapy [facility] for tasks assessed/performed             History reviewed. No pertinent past medical history. History reviewed. No pertinent surgical history. Patient Active Problem List   Diagnosis Date Noted   Foot pain, right 05/15/2022   Benign essential hypertension 09/02/2020   Diabetes (HCC) 09/02/2020   Dyslipidemia, goal LDL below 100 09/02/2020   Esophageal reflux 09/02/2020   Irritable bowel syndrome without diarrhea 09/02/2020   Primary osteoarthritis of knees, bilateral 09/02/2020   Psoriasis 09/02/2020   Vitamin D deficiency 09/02/2020   Abscess and cellulitis 02/08/2015   Recurrent umbilical hernia 10/23/2014   Hair loss 09/10/2014   Ventral hernia 09/10/2014    PCP: Jacinto Reap, MD  REFERRING PROVIDER: Alvester Morin, MD  REFERRING DIAG: 189.0  THERAPY DIAG:  Lymphedema, not elsewhere classified  Rationale for Evaluation and Treatment: Rehabilitation  ONSET DATE: 1.5 yrs after an episode of shingles  SUBJECTIVE:                                                                                                                                                                                           SUBJECTIVE STATEMENT: Hayley Barr presents to Occupational Therapy for initial treatment session for BLE lymphedema. Pt is unaccompanied She states she was able to tolerate compression wraps applied last session for > 24 hours without increased pain. Pt complains  of increased pain in L knee and back of leg , which she rates as 7/10. She saw the doctor who reassured her that nothing was out of place, but compensatory use may be exacerbating OA. R LEG ( Rx) pain is rated 2/10.   PERTINENT HISTORY: HTN, OA, DM type 2, pre-existing B leg and foot lymphedema 2/2 suspected venous insufficiency, R>L. R leg Fx with reconstruction of Achilles;  chronic plantar  fasciitis, Negative for DVT in 1923, Hx R foot abscess and cellulitis, IBS, Psoriasis, bilateral neuropathy feet and legs, currently has PT 2 x weekly   PAIN:  Are you having pain? YES, L leg and knee leg  pain 7/10; L     leg pain 2/10 Location: B legs, feet and ankles, R>L Description: heaviness, stiffness, tightness What makes it better? elevation What makes it worse? Standing, walking, dependent sitting  PRECAUTIONS: Fall Risk;  Infection Risk, LYMPHEDEMA precautions, DM type 2 precautions  WEIGHT BEARING RESTRICTIONS: No  FALLS:  Has patient fallen in last 6 months? Yes. 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  COGNITION:  Overall cognitive status: Within functional limits for tasks assessed   OBSERVATIONS / OTHER ASSESSMENTS:  Mild-moderate, BLE lymphedema 2/2 suspected venous insufficiency and obesity ( weight induced lymphedema.  POSTURE: WFL  LE ROM: WFL for  clinical tasks  LE MMT: WFL for clinical  task  LYMPHEDEMA ASSESSMENTS:   SURGERY TYPE/DATE: N/A, non cancer related  Hx INFECTIONS: NONE     Hx WOUNDS: currently has a dime sized non healing wound at base of R heel incision from achilles reconstruction   FOTO functional outcome measure: Initial 09/29/23: 50%  Lymphedema Life Impact Scale (LLIS) Initial 09/29/23: 47.06%  BLE COMPARATIVE LIMB VOLUMETRICS:   11/04/23 Initial  LANDMARK RIGHT   R LEG (A-D) 6686.8 ml  R THIGH (E-G) ml  R FULL LIMB (A-G) ml  Limb Volume differential (LVD)  0.5 %, L>R  Volume change since initial %  Volume change overall V  (Blank rows = not tested)  LANDMARK LEFT   L LEG (A-D) 6743.7 ml  L THIGH (E-G) ml  L FULL LIMB (A-G) ml  Limb Volume differential (LVD)  %  Volume change since initial %  Volume change overall %  (Blank rows = not tested) GAIT: Distance walked: arrived in transport wc. Able to transfer to Rx bed and back to transport wc using Single point cane (modified independence)  TODAY'S TREATMENT:                                                                                                                                          Pt/ family education re  LE self-care ho,me program- self-bandaging  PATIENT EDUCATION:  Continued Pt/ CG edu for lymphedema self care home program throughout session. Topics include outcome of comparative limb volumetrics- starting limb volume differentials (LVDs), technology and gradient techniques used for short stretch, multilayer compression wrapping, simple self-MLD, therapeutic lymphatic pumping exercises, skin/nail care, LE precautions,. compression garment recommendations and specifications, wear  and care schedule and compression garment donning / doffing w assistive devices. Discussed progress towards all OT goals since commencing CDT. All questions answered to the Pt's satisfaction. Good return. Person educated: Patient and family Education method: Explanation, Demonstration, and  Handouts Education comprehension: verbalized understanding, returned demonstration, verbal cues required, and needs further education  HOME EXERCISE PROGRAM: BLE lymphatic pumping there ex using- 1 set of 10 reps, each exercise in order-  1-2 x daily, bilaterally Simple self MLD 1 x daily Daily skin care and inspection to increase hydration, skin mobility and decrease infection risk- can be done during MLD Compression wraps 23/7 until garment fitting complete (multilayer compression wraps to R LEG using 1 each of 8, 10, and 12 cm wide short stretch compression wraps over single layer of stockinett and Rosidal Soft foam ( 0.4 cm thick) . Finished wraps with stretch net to keep tape in place. )  ASSESSMENT: CLINICAL IMPRESSION:  Pt edu emphasis today on teaching intro level simple self MLD, including lymphatic structure and function, lymphatic map, J stroke and neck sequences. teaching multilayer compression wrapping using gradient techniques. Pt did not demonstrate return, but verbalized understanding. Next visit we'll encourage hands on practice with cues PRN.  Continued teaching wrapping after MLD. Pt tolerated abbreviated MLD to RLE utilizing short neck sequence, deep abdominal pathway, functional inguinal LN, and J strokes from proximal thigh to distal foot, then back to neck to finish. Pt tolerated well.  Cont as per POC.  (10/29/23 INITIAL EVALUATION:  Hayley Barr is a 67 yo female presenting with mild, stage 2, BLE lymphedema, R>L.  Longstanding, mild, B ankle swelling is exacerbated for the past 1.5 years s/P an episode of shingles affecting the R leg, and a subsequent fall resulting in ankle fracture with Achilles reconstruction and ongoing, non-healing wound at the distal end of surgical scar. This situation is concerning re elevated infection risk from lymphatic dysfunction n the context of DM type II,  and ongoing functional decline due to associated problems with ambulation and  functional mobility.  Lymphedema occurs secondary to orthopedic trauma when the injury damages lymphatic vessels and structures in the area, leading to a buildup of fluid carrying cellular debris, waste products, bacteria, viruses, and damaged or abnormal cells in the tissues causing protein-rich swelling in the affected body part. This secondary lymphedema, often arising from significant soft tissue trauma accompanying the fracture, can potentially delay wound healing and complicate fracture management.   Progressing BLE lymphedema limits Hayley Barr's functional performance in all occupational domains, including functional ambulation and transfers, standing tolerance, dependent sitting for more than 15 minutes,  basic and instrumental ADLs performance, including lower body dressing, LB bathing, fitting preferred street shoes and LB clothing; driving, shopping, and home management . Lymphedema and associated pain limits Pt's ability to perform productive activities and  leisure pursuits and to participate in socialization at home and in the community.  BLE lymphedema contributes to elevated infection risk and increased falls risk due to body asymmetry. Hayley Barr will benefit from skilled OT for Complete Decongestive Therapy (CDT) the gold standard of lymphedema care, including manual lymphatic drainage (MLD), skin care to limit infection risk and increase skin excursion, lymphatic pumping exercise, and during the Intensive Phase multilayer, gradient compression bandaging to reduce limb volume. Once limb volume reduction reaches a clinical plateau custom compression garments that provide appropriate fit, compression and containment are fitted. Throughout the treatment course Pt/ caregiver will learn lymphedema prevention strategies and precautions, learn to perform  all LE self-care home program components. Without skilled OT for lymphedema care, Pt's condition will progress and further functional decline  is expected.)    OBJECTIVE IMPAIRMENTS: Abnormal gait, decreased activity tolerance, decreased balance, decreased knowledge of condition, decreased knowledge of use of DME, decreased mobility, difficulty walking, decreased ROM, decreased strength, increased edema, impaired flexibility, impaired sensation, improper body mechanics, obesity, pain, and chronic , progressive, BLE swelling with related tissue changes and thickening, increased infection risk, non-healing wound .   ACTIVITY LIMITATIONS: Difficulty performing basic and instrumental ADLs (LB dressing, LB bathing,bathing, toileting, dressing, hygiene/grooming, and functional ambulation. Difficulty performing leisure pursuits and productive activities requiring standing and/ or walking for > 19 minutes  PARTICIPATION LIMITATIONS:  Impaired social participation, decreased participation in church  PERSONAL FACTORS: Behavior pattern, Fitness, Past/current experiences, Social background, Time since onset of injury/illness/exacerbation, and 3+ comorbidities: OA, hx R ankle fracture and non-healing wound, hx plantar fasciitis  are also affecting patient's functional outcome.   REHAB POTENTIAL: Good  CLINICAL DECISION MAKING: Evolving/moderate complexity  EVALUATION COMPLEXITY: Moderate   GOALS: Goals reviewed with patient? Yes  SHORT TERM GOALS: Target date: 4th OT Rx visit   Pt will demonstrate understanding of lymphedema precautions and prevention strategies with modified independence using a printed reference to identify at least 5 precautions and discussing how s/he may implement them into daily life to reduce risk of progression with extra time. Baseline:Max A Goal status: INITIAL  2.  Pt will be able to apply multilayer, knee length, gradient, compression wraps to one leg at a time from toes to below knee with modified independence (extra time) to decrease limb volume, to limit infection risk, and to limit lymphedema progression.   Baseline: Dependent Goal status: INITIAL  LONG TERM GOALS: Target date: 02/18/24  Given this patient's Intake score of 50 % on the functional outcomes FOTO tool, patient will experience an increase in function of 5 points to improve basic and instrumental ADLs performance, including lymphedema self-care.  Baseline: 50 Goal status: INITIAL  2.  Given this patient's Intake score of 47.06 % on the Lymphedema Life Impact Scale (LLIS), patient will experience a reduction of at least 5 points in her perceived level of functional impairment resulting from lymphedema to improve functional performance and quality of life (QOL). Baseline: 47.06 % Goal status: INITIAL  3.  Pt will achieve at least a 10% volume reduction in B legs to return limb to typical size and shape, to limit infection risk and LE progression, to decrease pain, to improve function. Baseline: Dependent Goal status: INITIAL  4.  Pt will obtain appropriate compression garments/devices and achieve modified independence (extra time + assistive devices) with donning/doffing to optimize limb volume reductions and limit LE progression over time. Baseline: Dependent Goal status: INITIAL  During Intensive phase CDT , with modified independence, Pt will achieve at least 85% compliance with all lymphedema self-care home program components, including daily skin care, compression wraps and /or garments, simple self MLD and lymphatic pumping therex to habituate LE self care protocol  into ADLs for optimal LE self-management over time. Baseline: Dependent Goal status: INITIAL  PLAN:  OT FREQUENCY: 2x/week  OT DURATION: 12 weeks and PRN  PLANNED INTERVENTIONS: 97110-Therapeutic exercises, 97530- Therapeutic activity, 97140- Manual therapy, Dry Needling, Manual lymph drainage, and Compression bandaging Complete Decongestive Therapy: Manual lympathic drainage, skin ce,    compression wraps,, then fit with appropriate compression garments  during Self-management Phase.  Custom-made gradient compression garments and HOS devices are medically necessary  because they are uniquely sized and shaped to fit the exact dimensions of the affected extremities, and to provide appropriate medical grade, graduated compression essential for optimally managing chronic, progressive lymphedema. Multiple custom compression garments are needed to ensure proper hygiene to limit infection risk. Custom compression garments should be replaced q 3-6 months When worn consistently for optimal lipo-lymphedema self-management over time. HOS devices, medically necessary to limit fibrosis buildup in tissue, should be replaced q 2 years and PRN when worn out.     PLAN FOR NEXT SESSION:  Cont teaching compression wrapping Pt edu for lymphedema self-ca home program.  Loel Dubonnet, Hayley, OTR/L, CLT-LANA 11/14/23 12:52 PM

## 2023-12-02 ENCOUNTER — Encounter: Payer: Medicare PPO | Admitting: Occupational Therapy

## 2023-12-02 ENCOUNTER — Ambulatory Visit: Payer: Medicare PPO | Admitting: Occupational Therapy

## 2023-12-04 ENCOUNTER — Encounter: Payer: Self-pay | Admitting: Occupational Therapy

## 2023-12-04 ENCOUNTER — Ambulatory Visit: Payer: Medicare PPO | Attending: Cardiothoracic Surgery | Admitting: Occupational Therapy

## 2023-12-04 DIAGNOSIS — I890 Lymphedema, not elsewhere classified: Secondary | ICD-10-CM | POA: Diagnosis present

## 2023-12-04 NOTE — Therapy (Signed)
 OUTPATIENT OCCUPATIONAL THERAPY TREATMENT NOTE  LOWER EXTREMITY LYMPHEDEMA   Patient Name: Hayley Barr MRN: [id number] DOB:1956-02-08, 68 y.o., female Today's Date: 12/04/2023  END OF SESSION:   OT End of Session - 12/04/23 1115     Visit Number 5    Number of Visits 36    Date for OT Re-Evaluation 01/27/24    OT Start Time 1102    Activity Tolerance Patient tolerated treatment well;No increased pain    Behavior During Therapy [facility] for tasks assessed/performed             History reviewed. No pertinent past medical history. History reviewed. No pertinent surgical history. Patient Active Problem List   Diagnosis Date Noted   Foot pain, right 05/15/2022   Benign essential hypertension 09/02/2020   Diabetes (HCC) 09/02/2020   Dyslipidemia, goal LDL below 100 09/02/2020   Esophageal reflux 09/02/2020   Irritable bowel syndrome without diarrhea 09/02/2020   Primary osteoarthritis of knees, bilateral 09/02/2020   Psoriasis 09/02/2020   Vitamin D deficiency 09/02/2020   Abscess and cellulitis 02/08/2015   Recurrent umbilical hernia 10/23/2014   Hair loss 09/10/2014   Ventral hernia 09/10/2014    PCP: Dorenda Mormon, MD  REFERRING PROVIDER: Bert Damita Skene, MD  REFERRING DIAG: 189.0  THERAPY DIAG:  Lymphedema, not elsewhere classified  Rationale for Evaluation and Treatment: Rehabilitation  ONSET DATE: 1.5 yrs after an episode of shingles  SUBJECTIVE:                                                                                                                                                                                           SUBJECTIVE STATEMENT: Hayley Barr presents to Occupational Therapy for initial treatment session for BLE lymphedema. Pt was last seen on 11/14/23. Pt is unaccompanied  Pt complains arthritis related pain persists, but  she denies LE lymphedema-related pain today. She tells me she was diligent with compression wraps to R  leg during the holiday interval.   PERTINENT HISTORY: HTN, OA, DM type 2, pre-existing B leg and foot lymphedema 2/2 suspected venous insufficiency, R>L. R leg Fx with reconstruction of Achilles;  chronic plantar  fasciitis, Negative for DVT in 1923, Hx R foot abscess and cellulitis, IBS, Psoriasis, bilateral neuropathy feet and legs, currently has PT 2 x weekly   PAIN:  Are you having pain? YES, L leg and knee leg pain not rated/10 Location: B legs, feet and ankles, R>L Description: heaviness, stiffness, tightness What makes it better? elevation What makes it worse? Standing, walking, dependent sitting  PRECAUTIONS: Fall Risk;  Infection Risk, LYMPHEDEMA precautions, DM type 2 precautions  WEIGHT BEARING RESTRICTIONS:  No  FALLS:  Has patient fallen in last 6 months? Yes. Number of falls 1  LIVING ENVIRONMENT: Lives with: lives alone Lives in: House/apartment Stairs: Yes; Internal: 16 steps; on left going up Has following equipment at home: Quad cane small base, Environmental Consultant - 4 wheeled, Wheelchair (manual), shower chair, and Grab bars  OCCUPATION: retired orthoptist at [employer]: reading, working out on recumbent bike x weekly  HAND DOMINANCE: right   PRIOR LEVEL OF FUNCTION: Independent  PATIENT GOALS: I want to return to activities, walking, cooking for myself, traveling, going to church   OBJECTIVE: Note: Objective measures were completed at Evaluation unless otherwise noted.  COGNITION:  Overall cognitive status: Within functional limits for tasks assessed   OBSERVATIONS / OTHER ASSESSMENTS:  Mild-moderate, BLE lymphedema 2/2 suspected venous insufficiency and obesity ( weight induced lymphedema.  POSTURE: WFL  LE ROM: WFL for  clinical tasks  LE MMT: WFL for clinical task  LYMPHEDEMA ASSESSMENTS:   SURGERY TYPE/DATE: N/A, non cancer related  Hx INFECTIONS: NONE     Hx WOUNDS: currently has a dime sized non healing wound at base of R heel incision from  achilles reconstruction   FOTO functional outcome measure: Initial 09/29/23: 50%  Lymphedema Life Impact Scale (LLIS) Initial 09/29/23: 47.06%  BLE COMPARATIVE LIMB VOLUMETRICS:   11/04/23 Initial  LANDMARK RIGHT   R LEG (A-D) 6686.8 ml  R THIGH (E-G) ml  R FULL LIMB (A-G) ml  Limb Volume differential (LVD)  0.5 %, L>R  Volume change since initial %  Volume change overall V  (Blank rows = not tested)  LANDMARK LEFT   L LEG (A-D) 6743.7 ml  L THIGH (E-G) ml  L FULL LIMB (A-G) ml  Limb Volume differential (LVD)  %  Volume change since initial %  Volume change overall %  (Blank rows = not tested) GAIT: Distance walked: arrived in transport wc. Able to transfer to Rx bed and back to transport wc using Single point cane (modified independence)  TODAY'S TREATMENT:                                                                                                                                         MLD w simultaneous skin care to RLE/RLQ Pt/ family education re  LE self-care ho,me program- self-bandaging  PATIENT EDUCATION:  Continued Pt/ CG edu for lymphedema self care home program throughout session. Topics include outcome of comparative limb volumetrics- starting limb volume differentials (LVDs), technology and gradient techniques used for short stretch, multilayer compression wrapping, simple self-MLD, therapeutic lymphatic pumping exercises, skin/nail care, LE precautions,. compression garment recommendations and specifications, wear and care schedule and compression garment donning / doffing w assistive devices. Discussed progress towards all OT goals since commencing CDT. All questions answered to the Pt's satisfaction. Good return. Person educated: Patient and family Education method: Explanation, Demonstration, and Handouts Education comprehension: verbalized understanding,  returned demonstration, verbal cues required, and needs further education  HOME EXERCISE PROGRAM: BLE  lymphatic pumping there ex using- 1 set of 10 reps, each exercise in order-  1-2 x daily, bilaterally Simple self MLD 1 x daily Daily skin care and inspection to increase hydration, skin mobility and decrease infection risk- can be done during MLD Compression wraps 23/7 until garment fitting complete (multilayer compression wraps to R LEG using 1 each of 8, 10, and 12 cm wide short stretch compression wraps over single layer of stockinett and Rosidal Soft foam ( 0.4 cm thick) . Finished wraps with stretch net to keep tape in place. )  ASSESSMENT: CLINICAL IMPRESSION:  Pt did an excellent job during holiday interval with compression wrapping. Limb volume is significantly reduced today confirming excellent compliance. Skin is well hydrated and Pt denies LE related pain in the R leg.  We continued MLD  to the RLE utilizing short neck sequence, deep abdominal pathway, functional inguinal LN, and J strokes from proximal thigh to distal foot, then back to neck to finish. Pt tolerated well.  Next visit begin teaching re garment recommendations and plan timeline for measuring and fitting. Cont as per POC.  (10/29/23 INITIAL EVALUATION:  Hayley Barr is a 68 yo female presenting with mild, stage 2, BLE lymphedema, R>L.  Longstanding, mild, B ankle swelling is exacerbated for the past 1.5 years s/P an episode of shingles affecting the R leg, and a subsequent fall resulting in ankle fracture with Achilles reconstruction and ongoing, non-healing wound at the distal end of surgical scar. This situation is concerning re elevated infection risk from lymphatic dysfunction n the context of DM type II,  and ongoing functional decline due to associated problems with ambulation and functional mobility.  Lymphedema occurs secondary to orthopedic trauma when the injury damages lymphatic vessels and structures in the area, leading to a buildup of fluid carrying cellular debris, waste products, bacteria, viruses, and damaged  or abnormal cells in the tissues causing protein-rich swelling in the affected body part. This secondary lymphedema, often arising from significant soft tissue trauma accompanying the fracture, can potentially delay wound healing and complicate fracture management.   Progressing BLE lymphedema limits Hayley Barr's functional performance in all occupational domains, including functional ambulation and transfers, standing tolerance, dependent sitting for more than 15 minutes,  basic and instrumental ADLs performance, including lower body dressing, LB bathing, fitting preferred street shoes and LB clothing; driving, shopping, and home management . Lymphedema and associated pain limits Pt's ability to perform productive activities and  leisure pursuits and to participate in socialization at home and in the community.  BLE lymphedema contributes to elevated infection risk and increased falls risk due to body asymmetry. Hayley Barr will benefit from skilled OT for Complete Decongestive Therapy (CDT) the gold standard of lymphedema care, including manual lymphatic drainage (MLD), skin care to limit infection risk and increase skin excursion, lymphatic pumping exercise, and during the Intensive Phase multilayer, gradient compression bandaging to reduce limb volume. Once limb volume reduction reaches a clinical plateau custom compression garments that provide appropriate fit, compression and containment are fitted. Throughout the treatment course Pt/ caregiver will learn lymphedema prevention strategies and precautions, learn to perform all LE self-care home program components. Without skilled OT for lymphedema care, Pt's condition will progress and further functional decline is expected.)    OBJECTIVE IMPAIRMENTS: Abnormal gait, decreased activity tolerance, decreased balance, decreased knowledge of condition, decreased knowledge of use of DME, decreased mobility, difficulty walking, decreased  ROM, decreased  strength, increased edema, impaired flexibility, impaired sensation, improper body mechanics, obesity, pain, and chronic , progressive, BLE swelling with related tissue changes and thickening, increased infection risk, non-healing wound .   ACTIVITY LIMITATIONS: Difficulty performing basic and instrumental ADLs (LB dressing, LB bathing,bathing, toileting, dressing, hygiene/grooming, and functional ambulation. Difficulty performing leisure pursuits and productive activities requiring standing and/ or walking for > 19 minutes  PARTICIPATION LIMITATIONS:  Impaired social participation, decreased participation in church  PERSONAL FACTORS: Behavior pattern, Fitness, Past/current experiences, Social background, Time since onset of injury/illness/exacerbation, and 3+ comorbidities: OA, hx R ankle fracture and non-healing wound, hx plantar fasciitis  are also affecting patient's functional outcome.   REHAB POTENTIAL: Good  CLINICAL DECISION MAKING: Evolving/moderate complexity  EVALUATION COMPLEXITY: Moderate   GOALS: Goals reviewed with patient? Yes  SHORT TERM GOALS: Target date: 4th OT Rx visit   Pt will demonstrate understanding of lymphedema precautions and prevention strategies with modified independence using a printed reference to identify at least 5 precautions and discussing how s/he may implement them into daily life to reduce risk of progression with extra time. Baseline:Max A Goal status: INITIAL  2.  Pt will be able to apply multilayer, knee length, gradient, compression wraps to one leg at a time from toes to below knee with modified independence (extra time) to decrease limb volume, to limit infection risk, and to limit lymphedema progression.  Baseline: Dependent Goal status: INITIAL  LONG TERM GOALS: Target date: 02/18/24  Given this patient's Intake score of 50 % on the functional outcomes FOTO tool, patient will experience an increase in function of 5 points to improve basic  and instrumental ADLs performance, including lymphedema self-care.  Baseline: 50 Goal status: INITIAL  2.  Given this patient's Intake score of 47.06 % on the Lymphedema Life Impact Scale (LLIS), patient will experience a reduction of at least 5 points in her perceived level of functional impairment resulting from lymphedema to improve functional performance and quality of life (QOL). Baseline: 47.06 % Goal status: INITIAL  3.  Pt will achieve at least a 10% volume reduction in B legs to return limb to typical size and shape, to limit infection risk and LE progression, to decrease pain, to improve function. Baseline: Dependent Goal status: INITIAL  4.  Pt will obtain appropriate compression garments/devices and achieve modified independence (extra time + assistive devices) with donning/doffing to optimize limb volume reductions and limit LE progression over time. Baseline: Dependent Goal status: INITIAL  During Intensive phase CDT , with modified independence, Pt will achieve at least 85% compliance with all lymphedema self-care home program components, including daily skin care, compression wraps and /or garments, simple self MLD and lymphatic pumping therex to habituate LE self care protocol  into ADLs for optimal LE self-management over time. Baseline: Dependent Goal status: INITIAL  PLAN:  OT FREQUENCY: 2x/week  OT DURATION: 12 weeks and PRN  PLANNED INTERVENTIONS: 97110-Therapeutic exercises, 97530- Therapeutic activity, 97140- Manual therapy, Dry Needling, Manual lymph drainage, and Compression bandaging Complete Decongestive Therapy: Manual lympathic drainage, skin ce,    compression wraps,, then fit with appropriate compression garments during Self-management Phase.  Custom-made gradient compression garments and HOS devices are medically necessary because they are uniquely sized and shaped to fit the exact dimensions of the affected extremities, and to provide appropriate medical  grade, graduated compression essential for optimally managing chronic, progressive lymphedema. Multiple custom compression garments are needed to ensure proper hygiene to limit infection risk. Custom compression garments should  be replaced q 3-6 months When worn consistently for optimal lipo-lymphedema self-management over time. HOS devices, medically necessary to limit fibrosis buildup in tissue, should be replaced q 2 years and PRN when worn out.     PLAN FOR NEXT SESSION:  Cont teaching compression wrapping Pt edu for lymphedema self-ca home program.  Zebedee Dec, Hayley, OTR/L, CLT-LANA 12/04/23 11:18 AM

## 2023-12-05 ENCOUNTER — Encounter: Payer: Self-pay | Admitting: Occupational Therapy

## 2023-12-05 ENCOUNTER — Encounter: Payer: Medicare PPO | Admitting: Occupational Therapy

## 2023-12-09 ENCOUNTER — Ambulatory Visit: Payer: Medicare PPO | Admitting: Occupational Therapy

## 2023-12-09 ENCOUNTER — Encounter: Payer: Medicare PPO | Admitting: Occupational Therapy

## 2023-12-10 ENCOUNTER — Ambulatory Visit: Payer: Medicare PPO | Admitting: Occupational Therapy

## 2023-12-10 ENCOUNTER — Encounter: Payer: Self-pay | Admitting: Occupational Therapy

## 2023-12-10 DIAGNOSIS — I890 Lymphedema, not elsewhere classified: Secondary | ICD-10-CM

## 2023-12-10 NOTE — Therapy (Signed)
 OUTPATIENT OCCUPATIONAL THERAPY TREATMENT NOTE  LOWER EXTREMITY LYMPHEDEMA   Patient Name: Hayley Barr MRN: [id number] DOB:20-Aug-1956, 68 y.o., female Today's Date: 12/10/2023  END OF SESSION:   OT End of Session - 12/10/23 0759     Visit Number 6    Number of Visits 36    Date for OT Re-Evaluation 01/27/24    OT Start Time 0802    OT Stop Time 0905    OT Time Calculation (min) 63 min    Activity Tolerance Patient tolerated treatment well;No increased pain    Behavior During Therapy [facility] for tasks assessed/performed             History reviewed. No pertinent past medical history. History reviewed. No pertinent surgical history. Patient Active Problem List   Diagnosis Date Noted   Foot pain, right 05/15/2022   Benign essential hypertension 09/02/2020   Diabetes (HCC) 09/02/2020   Dyslipidemia, goal LDL below 100 09/02/2020   Esophageal reflux 09/02/2020   Irritable bowel syndrome without diarrhea 09/02/2020   Primary osteoarthritis of knees, bilateral 09/02/2020   Psoriasis 09/02/2020   Vitamin D deficiency 09/02/2020   Abscess and cellulitis 02/08/2015   Recurrent umbilical hernia 10/23/2014   Hair loss 09/10/2014   Ventral hernia 09/10/2014    PCP: Dorenda Mormon, MD  REFERRING PROVIDER: Bert Damita Skene, MD  REFERRING DIAG: 189.0  THERAPY DIAG:  Lymphedema, not elsewhere classified  Rationale for Evaluation and Treatment: Rehabilitation  ONSET DATE: 1.5 yrs after an episode of shingles  SUBJECTIVE:                                                                                                                                                                                           SUBJECTIVE STATEMENT: Hayley Barr presents to Occupational Therapy for initial treatment session for BLE lymphedema. Pt denies LE lymphedema-related pain today. She tells me she was diligent with compression wraps to R leg. Pt presents with wraps in  place.   PERTINENT HISTORY: HTN, OA, DM type 2, pre-existing B leg and foot lymphedema 2/2 suspected venous insufficiency, R>L. R leg Fx with reconstruction of Achilles;  chronic plantar  fasciitis, Negative for DVT in 1923, Hx R foot abscess and cellulitis, IBS, Psoriasis, bilateral neuropathy feet and legs, currently has PT 2 x weekly   PAIN:  Are you having pain? YES, L leg and knee leg pain not rated/10 Location: B legs, feet and ankles, R>L Description: heaviness, stiffness, tightness What makes it better? elevation What makes it worse? Standing, walking, dependent sitting  PRECAUTIONS: Fall Risk;  Infection Risk, LYMPHEDEMA precautions, DM type 2 precautions  WEIGHT BEARING RESTRICTIONS:  No  FALLS:  Has patient fallen in last 6 months? Yes. Number of falls 1  LIVING ENVIRONMENT: Lives with: lives alone Lives in: House/apartment Stairs: Yes; Internal: 16 steps; on left going up Has following equipment at home: Quad cane small base, Environmental Consultant - 4 wheeled, Wheelchair (manual), shower chair, and Grab bars  OCCUPATION: retired orthoptist at [employer]: reading, working out on recumbent bike x weekly  HAND DOMINANCE: right   PRIOR LEVEL OF FUNCTION: Independent  PATIENT GOALS: I want to return to activities, walking, cooking for myself, traveling, going to church   OBJECTIVE: Note: Objective measures were completed at Evaluation unless otherwise noted.  COGNITION:  Overall cognitive status: Within functional limits for tasks assessed   OBSERVATIONS / OTHER ASSESSMENTS:  Mild-moderate, BLE lymphedema 2/2 suspected venous insufficiency and obesity ( weight induced lymphedema.  POSTURE: WFL  LE ROM: WFL for  clinical tasks  LE MMT: WFL for clinical task  LYMPHEDEMA ASSESSMENTS:   SURGERY TYPE/DATE: N/A, non cancer related  Hx INFECTIONS: NONE     Hx WOUNDS: currently has a dime sized non healing wound at base of R heel incision from achilles reconstruction   FOTO  functional outcome measure: Initial 09/29/23: 50%  Lymphedema Life Impact Scale (LLIS) Initial 09/29/23: 47.06%  BLE COMPARATIVE LIMB VOLUMETRICS:   11/04/23 Initial  LANDMARK RIGHT   R LEG (A-D) 6686.8 ml  R THIGH (E-G) ml  R FULL LIMB (A-G) ml  Limb Volume differential (LVD)  0.5 %, L>R  Volume change since initial %  Volume change overall V  (Blank rows = not tested)  LANDMARK LEFT   L LEG (A-D) 6743.7 ml  L THIGH (E-G) ml  L FULL LIMB (A-G) ml  Limb Volume differential (LVD)  %  Volume change since initial %  Volume change overall %   (Blank rows = not tested)  6th visit RLE 12/10/23  LANDMARK RIGHT   R LEG (A-D) 5451.6 ml  R THIGH (E-G) ml  R FULL LIMB (A-G) ml  Limb Volume differential (LVD)  %  Volume change since initial  DEC 18.47% since 11/04/23  Volume change overall V  (Blank rows = not tested) GAIT: Distance walked: arrived in transport wc. Able to transfer to Rx bed and back to transport wc using Single point cane (modified independence)  TODAY'S TREATMENT:                                                                                                                                         MLD w simultaneous skin care to RLE/RLQ Pt/ family education re  LE self-care ho,me program- self-bandaging  PATIENT EDUCATION:  Continued Pt/ CG edu for lymphedema self care home program throughout session. Topics include outcome of comparative limb volumetrics- starting limb volume differentials (LVDs), technology and gradient techniques used for short stretch, multilayer compression wrapping, simple self-MLD, therapeutic lymphatic pumping exercises,  skin/nail care, LE precautions,. compression garment recommendations and specifications, wear and care schedule and compression garment donning / doffing w assistive devices. Discussed progress towards all OT goals since commencing CDT. All questions answered to the Pt's satisfaction. Good return. Person educated: Patient and  family Education method: Explanation, Demonstration, and Handouts Education comprehension: verbalized understanding, returned demonstration, verbal cues required, and needs further education  HOME EXERCISE PROGRAM: BLE lymphatic pumping there ex using- 1 set of 10 reps, each exercise in order-  1-2 x daily, bilaterally Simple self MLD 1 x daily Daily skin care and inspection to increase hydration, skin mobility and decrease infection risk- can be done during MLD Compression wraps 23/7 until garment fitting complete (multilayer compression wraps to R LEG using 1 each of 8, 10, and 12 cm wide short stretch compression wraps over single layer of stockinett and Rosidal Soft foam ( 0.4 cm thick) . Finished wraps with stretch net to keep tape in place. )  ASSESSMENT: CLINICAL IMPRESSION: R LEG volume is decreased by 18.47% since 11/04/23. This value meets and exceeds the R leg volume reduction goal. Pt reports wound at heel is nearly closed. While providing manual therapy Pt and OT discussed POC going forward and agree  that R limb volume is reduced to the point  that we can complete R  custom compression garment and device measurements at next visit. Pt educated about garment measurement and fitting processes, roll of DME provider, and typical insurance coverage for Medicare recipients.  Cont as per POC.  (10/29/23 INITIAL EVALUATION:  Hayley Barr is a 68 yo female presenting with mild, stage 2, BLE lymphedema, R>L.  Longstanding, mild, B ankle swelling is exacerbated for the past 1.5 years s/P an episode of shingles affecting the R leg, and a subsequent fall resulting in ankle fracture with Achilles reconstruction and ongoing, non-healing wound at the distal end of surgical scar. This situation is concerning re elevated infection risk from lymphatic dysfunction n the context of DM type II,  and ongoing functional decline due to associated problems with ambulation and functional mobility.  Lymphedema  occurs secondary to orthopedic trauma when the injury damages lymphatic vessels and structures in the area, leading to a buildup of fluid carrying cellular debris, waste products, bacteria, viruses, and damaged or abnormal cells in the tissues causing protein-rich swelling in the affected body part. This secondary lymphedema, often arising from significant soft tissue trauma accompanying the fracture, can potentially delay wound healing and complicate fracture management.   Progressing BLE lymphedema limits Hayley Barr functional performance in all occupational domains, including functional ambulation and transfers, standing tolerance, dependent sitting for more than 15 minutes,  basic and instrumental ADLs performance, including lower body dressing, LB bathing, fitting preferred street shoes and LB clothing; driving, shopping, and home management . Lymphedema and associated pain limits Pt's ability to perform productive activities and  leisure pursuits and to participate in socialization at home and in the community.  BLE lymphedema contributes to elevated infection risk and increased falls risk due to body asymmetry. Hayley Barr will benefit from skilled OT for Complete Decongestive Therapy (CDT) the gold standard of lymphedema care, including manual lymphatic drainage (MLD), skin care to limit infection risk and increase skin excursion, lymphatic pumping exercise, and during the Intensive Phase multilayer, gradient compression bandaging to reduce limb volume. Once limb volume reduction reaches a clinical plateau custom compression garments that provide appropriate fit, compression and containment are fitted. Throughout the treatment course Pt/ caregiver will learn lymphedema  prevention strategies and precautions, learn to perform all LE self-care home program components. Without skilled OT for lymphedema care, Pt's condition will progress and further functional decline is expected.)    OBJECTIVE  IMPAIRMENTS: Abnormal gait, decreased activity tolerance, decreased balance, decreased knowledge of condition, decreased knowledge of use of DME, decreased mobility, difficulty walking, decreased ROM, decreased strength, increased edema, impaired flexibility, impaired sensation, improper body mechanics, obesity, pain, and chronic , progressive, BLE swelling with related tissue changes and thickening, increased infection risk, non-healing wound .   ACTIVITY LIMITATIONS: Difficulty performing basic and instrumental ADLs (LB dressing, LB bathing,bathing, toileting, dressing, hygiene/grooming, and functional ambulation. Difficulty performing leisure pursuits and productive activities requiring standing and/ or walking for > 19 minutes  PARTICIPATION LIMITATIONS:  Impaired social participation, decreased participation in church  PERSONAL FACTORS: Behavior pattern, Fitness, Past/current experiences, Social background, Time since onset of injury/illness/exacerbation, and 3+ comorbidities: OA, hx R ankle fracture and non-healing wound, hx plantar fasciitis  are also affecting patient's functional outcome.   REHAB POTENTIAL: Good  CLINICAL DECISION MAKING: Evolving/moderate complexity  EVALUATION COMPLEXITY: Moderate   GOALS: Goals reviewed with patient? Yes  SHORT TERM GOALS: Target date: 4th OT Rx visit   Pt will demonstrate understanding of lymphedema precautions and prevention strategies with modified independence using a printed reference to identify at least 5 precautions and discussing how s/he may implement them into daily life to reduce risk of progression with extra time. Baseline:Max A Goal status: PROGRESSING  2.  Pt will be able to apply multilayer, knee length, gradient, compression wraps to one leg at a time from toes to below knee with modified independence (extra time) to decrease limb volume, to limit infection risk, and to limit lymphedema progression.  Baseline: Dependent Goal  status: MET  LONG TERM GOALS: Target date: 02/18/24  Given this patient's Intake score of 50 % on the functional outcomes FOTO tool, patient will experience an increase in function of 5 points to improve basic and instrumental ADLs performance, including lymphedema self-care.  Baseline: 50 Goal status: INITIAL  2.  Given this patient's Intake score of 47.06 % on the Lymphedema Life Impact Scale (LLIS), patient will experience a reduction of at least 5 points in her perceived level of functional impairment resulting from lymphedema to improve functional performance and quality of life (QOL). Baseline: 47.06 % Goal status: INITIAL  3.  Pt will achieve at least a 10% volume reduction in B legs to return limb to typical size and shape, to limit infection risk and LE progression, to decrease pain, to improve function. Baseline: Dependent Goal status: Progressing with R LEG volume decreased by 18.47% in 6 visits to date.  4.  Pt will obtain appropriate compression garments/devices and achieve modified independence (extra time + assistive devices) with donning/doffing to optimize limb volume reductions and limit LE progression over time. Baseline: Dependent Goal status: INITIAL  During Intensive phase CDT , with modified independence, Pt will achieve at least 85% compliance with all lymphedema self-care home program components, including daily skin care, compression wraps and /or garments, simple self MLD and lymphatic pumping therex to habituate LE self care protocol  into ADLs for optimal LE self-management over time. Baseline: Dependent Goal status:PROGRESSING  PLAN:  OT FREQUENCY: 2x/week  OT DURATION: 12 weeks and PRN  PLANNED INTERVENTIONS: 97110-Therapeutic exercises, 97530- Therapeutic activity, 97140- Manual therapy, Dry Needling, Manual lymph drainage, and Compression bandaging Complete Decongestive Therapy: Manual lympathic drainage, skin ce,    compression wraps,, then fit with  appropriate compression garments during Self-management Phase.  Custom-made gradient compression garments and HOS devices are medically necessary because they are uniquely sized and shaped to fit the exact dimensions of the affected extremities, and to provide appropriate medical grade, graduated compression essential for optimally managing chronic, progressive lymphedema. Multiple custom compression garments are needed to ensure proper hygiene to limit infection risk. Custom compression garments should be replaced q 3-6 months When worn consistently for optimal lipo-lymphedema self-management over time. HOS devices, medically necessary to limit fibrosis buildup in tissue, should be replaced q 2 years and PRN when worn out.     PLAN FOR NEXT SESSION:  Cont teaching compression wrapping Pt edu for lymphedema self-ca home program.  Zebedee Dec, Hayley, OTR/L, CLT-LANA 12/10/23 1:34 PM

## 2023-12-12 ENCOUNTER — Encounter: Payer: Self-pay | Admitting: Occupational Therapy

## 2023-12-12 ENCOUNTER — Ambulatory Visit: Payer: Medicare PPO | Admitting: Occupational Therapy

## 2023-12-12 DIAGNOSIS — I890 Lymphedema, not elsewhere classified: Secondary | ICD-10-CM | POA: Diagnosis not present

## 2023-12-12 NOTE — Therapy (Signed)
OUTPATIENT OCCUPATIONAL THERAPY TREATMENT NOTE  LOWER EXTREMITY LYMPHEDEMA   Patient Name: Hayley Barr MRN: [id number] DOB:04-06-1956, 68 y.o., female Today's Date: 12/12/2023  END OF SESSION:   OT End of Session - 12/12/23 1012     Visit Number 7    Number of Visits 36    Date for OT Re-Evaluation 01/27/24    OT Start Time 1005    OT Stop Time 1110    OT Time Calculation (min) 65 min    Activity Tolerance Patient tolerated treatment well;No increased pain    Behavior During Therapy [facility] for tasks assessed/performed             History reviewed. No pertinent past medical history. History reviewed. No pertinent surgical history. Patient Active Problem List   Diagnosis Date Noted   Foot pain, right 05/15/2022   Benign essential hypertension 09/02/2020   Diabetes (HCC) 09/02/2020   Dyslipidemia, goal LDL below 100 09/02/2020   Esophageal reflux 09/02/2020   Irritable bowel syndrome without diarrhea 09/02/2020   Primary osteoarthritis of knees, bilateral 09/02/2020   Psoriasis 09/02/2020   Vitamin D deficiency 09/02/2020   Abscess and cellulitis 02/08/2015   Recurrent umbilical hernia 10/23/2014   Hair loss 09/10/2014   Ventral hernia 09/10/2014    PCP: Jacinto Reap, MD  REFERRING PROVIDER: Alvester Morin, MD  REFERRING DIAG: 189.0  THERAPY DIAG:  Lymphedema, not elsewhere classified  Rationale for Evaluation and Treatment: Rehabilitation  ONSET DATE: 1.5 yrs after an episode of shingles  SUBJECTIVE:                                                                                                                                                                                           SUBJECTIVE STATEMENT: Hayley Barr presents to Occupational Therapy for initial treatment session for BLE lymphedema. Pt denies LE lymphedema-related pain today. She tells me she was diligent with compression wraps to R leg. Pt presents with wraps in place. Pt in  agreement with plan to complete RLE garment and device measurements today.   PERTINENT HISTORY: HTN, OA, DM type 2, pre-existing B leg and foot lymphedema 2/2 suspected venous insufficiency, R>L. R leg Fx with reconstruction of Achilles;  chronic plantar  fasciitis, Negative for DVT in 1923, Hx R foot abscess and cellulitis, IBS, Psoriasis, bilateral neuropathy feet and legs, currently has PT 2 x weekly   PAIN:  Are you having pain? YES, L leg and knee leg pain not rated/10 Location: B legs, feet and ankles, R>L Description: heaviness, stiffness, tightness What makes it better? elevation What makes it worse? Standing, walking, dependent sitting  PRECAUTIONS: Fall Risk;  Infection Risk, LYMPHEDEMA precautions, DM type 2 precautions  WEIGHT BEARING RESTRICTIONS: No  FALLS:  Has patient fallen in last 6 months? Yes. 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  COGNITION:  Overall cognitive status: Within functional limits for tasks assessed   OBSERVATIONS / OTHER ASSESSMENTS:  Mild-moderate, BLE lymphedema 2/2 suspected venous insufficiency and obesity ( weight induced lymphedema.  POSTURE: WFL  LE ROM: WFL for  clinical tasks  LE MMT: WFL for clinical task  LYMPHEDEMA ASSESSMENTS:   SURGERY TYPE/DATE: N/A, non cancer related  Hx INFECTIONS: NONE     Hx WOUNDS: currently has a dime sized non healing  wound at base of R heel incision from achilles reconstruction   FOTO functional outcome measure: Initial 09/29/23: 50%  Lymphedema Life Impact Scale (LLIS) Initial 09/29/23: 47.06%  BLE COMPARATIVE LIMB VOLUMETRICS:   11/04/23 Initial  LANDMARK RIGHT   R LEG (A-D) 6686.8 ml  R THIGH (E-G) ml  R FULL LIMB (A-G) ml  Limb Volume differential (LVD)  0.5 %, L>R  Volume change since initial %  Volume change overall V  (Blank rows = not tested)  LANDMARK LEFT   L LEG (A-D) 6743.7 ml  L THIGH (E-G) ml  L FULL LIMB (A-G) ml  Limb Volume differential (LVD)  %  Volume change since initial %  Volume change overall %   (Blank rows = not tested)  6th visit RLE 12/10/23  LANDMARK RIGHT   R LEG (A-D) 5451.6 ml  R THIGH (E-G) ml  R FULL LIMB (A-G) ml  Limb Volume differential (LVD)  %  Volume change since initial  DEC 18.47% since 11/04/23  Volume change overall V  (Blank rows = not tested) GAIT: Distance walked: arrived in transport wc. Able to transfer to Rx bed and back to transport wc using Single point cane (modified independence)  TODAY'S TREATMENT:                                                                                                                                         Anatomical measurements for RLE compression garment and HOS device Pt/ family education re  LE self-care home program  PATIENT EDUCATION:  Continued Pt/ CG edu for lymphedema self care home program throughout session. Topics include outcome of comparative limb volumetrics- starting limb volume differentials (LVDs), technology and gradient techniques  used for short stretch, multilayer compression wrapping, simple self-MLD, therapeutic lymphatic pumping exercises, skin/nail care, LE precautions,. compression garment recommendations and specifications, wear and care schedule and compression garment donning / doffing w assistive devices. Discussed progress towards all OT goals since commencing CDT. All  questions answered to the Pt's satisfaction. Good return. Person educated: Patient and family Education method: Explanation, Demonstration, and Handouts Education comprehension: verbalized understanding, returned demonstration, verbal cues required, and needs further education  HOME EXERCISE PROGRAM: BLE lymphatic pumping there ex using- 1 set of 10 reps, each exercise in order-  1-2 x daily, bilaterally Simple self MLD 1 x daily Daily skin care and inspection to increase hydration, skin mobility and decrease infection risk- can be done during MLD Compression wraps 23/7 until garment fitting complete (multilayer compression wraps to R LEG using 1 each of 8, 10, and 12 cm wide short stretch compression wraps over single layer of stockinett and Rosidal Soft foam ( 0.4 cm thick) . Finished wraps with stretch net to keep tape in place. )  ASSESSMENT: CLINICAL IMPRESSION: Provided Pt edu throughout session for compression garment options and individualized recommendations . Completed measurements for one R  Elvarex custom, flat knit, ccl 2 ( 23-32 mmHg) compression knee high for R Leg. Completed measurements for custom Jobst RELAX convoluted HOS device needed to improve lymphatic function and limit fibrosis accumulation     and worsening during HOS and periods of inactivity. Complete measurements for Juzo OTS , ccl 2 ( 30-40 mmHg) circular knit    knee length garments to be worn while awaiting delivery of custom garments vs bandaging while awaiting custom garment delivery. Reapplied compression wraps as established. Cont as per POC.    (10/29/23 INITIAL EVALUATION:  Hayley Barr is a 68 yo female presenting with mild, stage 2, BLE lymphedema, R>L.  Longstanding, mild, B ankle swelling is exacerbated for the past 1.5 years s/P an episode of shingles affecting the R leg, and a subsequent fall resulting in ankle fracture with Achilles reconstruction and ongoing, non-healing wound at the distal end of  surgical scar. This situation is concerning re elevated infection risk from lymphatic dysfunction n the context of DM type II,  and ongoing functional decline due to associated problems with ambulation and functional mobility.  Lymphedema occurs secondary to orthopedic trauma when the injury damages lymphatic vessels and structures in the area, leading to a buildup of fluid carrying cellular debris, waste products, bacteria, viruses, and damaged or abnormal cells in the tissues causing protein-rich swelling in the affected body part. This secondary lymphedema, often arising from significant soft tissue trauma accompanying the fracture, can potentially delay wound healing and complicate fracture management.   Progressing BLE lymphedema limits Hayley Kozuch's functional performance in all occupational domains, including functional ambulation and transfers, standing tolerance, dependent sitting for more than 15 minutes,  basic and instrumental ADLs performance, including lower body dressing, LB bathing, fitting preferred street shoes and LB clothing; driving, shopping, and home management . Lymphedema and associated pain limits Pt's ability to perform productive activities and  leisure pursuits and to participate in socialization at home and in the community.  BLE lymphedema contributes to elevated infection risk and increased falls risk due to body asymmetry. Hayley Uram will benefit from skilled OT for Complete Decongestive Therapy (CDT) the gold standard of lymphedema care, including manual lymphatic drainage (MLD), skin care to limit infection risk and increase skin excursion, lymphatic pumping exercise, and during the Intensive Phase multilayer, gradient compression bandaging to  reduce limb volume. Once limb volume reduction reaches a clinical plateau custom compression garments that provide appropriate fit, compression and containment are fitted. Throughout the treatment course Pt/ caregiver will learn  lymphedema prevention strategies and precautions, learn to perform all LE self-care home program components. Without skilled OT for lymphedema care, Pt's condition will progress and further functional decline is expected.)    OBJECTIVE IMPAIRMENTS: Abnormal gait, decreased activity tolerance, decreased balance, decreased knowledge of condition, decreased knowledge of use of DME, decreased mobility, difficulty walking, decreased ROM, decreased strength, increased edema, impaired flexibility, impaired sensation, improper body mechanics, obesity, pain, and chronic , progressive, BLE swelling with related tissue changes and thickening, increased infection risk, non-healing wound .   ACTIVITY LIMITATIONS: Difficulty performing basic and instrumental ADLs (LB dressing, LB bathing,bathing, toileting, dressing, hygiene/grooming, and functional ambulation. Difficulty performing leisure pursuits and productive activities requiring standing and/ or walking for > 19 minutes  PARTICIPATION LIMITATIONS:  Impaired social participation, decreased participation in church  PERSONAL FACTORS: Behavior pattern, Fitness, Past/current experiences, Social background, Time since onset of injury/illness/exacerbation, and 3+ comorbidities: OA, hx R ankle fracture and non-healing wound, hx plantar fasciitis  are also affecting patient's functional outcome.   REHAB POTENTIAL: Good  CLINICAL DECISION MAKING: Evolving/moderate complexity  EVALUATION COMPLEXITY: Moderate   GOALS: Goals reviewed with patient? Yes  SHORT TERM GOALS: Target date: 4th OT Rx visit   Pt will demonstrate understanding of lymphedema precautions and prevention strategies with modified independence using a printed reference to identify at least 5 precautions and discussing how s/he may implement them into daily life to reduce risk of progression with extra time. Baseline:Max A Goal status: PROGRESSING  2.  Pt will be able to apply multilayer,  knee length, gradient, compression wraps to one leg at a time from toes to below knee with modified independence (extra time) to decrease limb volume, to limit infection risk, and to limit lymphedema progression.  Baseline: Dependent Goal status: MET  LONG TERM GOALS: Target date: 02/18/24  Given this patient's Intake score of 50 % on the functional outcomes FOTO tool, patient will experience an increase in function of 5 points to improve basic and instrumental ADLs performance, including lymphedema self-care.  Baseline: 50 Goal status: INITIAL  2.  Given this patient's Intake score of 47.06 % on the Lymphedema Life Impact Scale (LLIS), patient will experience a reduction of at least 5 points in her perceived level of functional impairment resulting from lymphedema to improve functional performance and quality of life (QOL). Baseline: 47.06 % Goal status: INITIAL  3.  Pt will achieve at least a 10% volume reduction in B legs to return limb to typical size and shape, to limit infection risk and LE progression, to decrease pain, to improve function. Baseline: Dependent Goal status: Progressing with R LEG volume decreased by 18.47% in 6 visits to date.  4.  Pt will obtain appropriate compression garments/devices and achieve modified independence (extra time + assistive devices) with donning/doffing to optimize limb volume reductions and limit LE progression over time. Baseline: Dependent Goal status: INITIAL  During Intensive phase CDT , with modified independence, Pt will achieve at least 85% compliance with all lymphedema self-care home program components, including daily skin care, compression wraps and /or garments, simple self MLD and lymphatic pumping therex to habituate LE self care protocol  into ADLs for optimal LE self-management over time. Baseline: Dependent Goal status:PROGRESSING  PLAN:  OT FREQUENCY: 2x/week  OT DURATION: 12 weeks and PRN  PLANNED INTERVENTIONS:  97110-Therapeutic exercises, 97530- Therapeutic activity, 97140- Manual therapy, Dry Needling, Manual lymph drainage, and Compression bandaging Complete Decongestive Therapy: Manual lympathic drainage, skin care,    compression wraps,, then fit with appropriate compression garments during Self-management Phase.  Custom-made gradient compression garments and HOS devices are medically necessary because they are uniquely sized and shaped to fit the exact dimensions of the affected extremities, and to provide appropriate medical grade, graduated compression essential for optimally managing chronic, progressive lymphedema. Multiple custom compression garments are needed to ensure proper hygiene to limit infection risk. Custom compression garments should be replaced q 3-6 months When worn consistently for optimal lipo-lymphedema self-management over time. HOS devices, medically necessary to limit fibrosis buildup in tissue, should be replaced q 2 years and PRN when worn out.     PLAN FOR NEXT SESSION:   Pt edu for lymphedema self-ca home program.  Loel Dubonnet, Hayley, OTR/L, CLT-LANA 12/12/23 12:25 PM

## 2023-12-16 ENCOUNTER — Encounter: Payer: Medicare PPO | Admitting: Occupational Therapy

## 2023-12-18 ENCOUNTER — Ambulatory Visit: Payer: Medicare PPO | Admitting: Occupational Therapy

## 2023-12-19 ENCOUNTER — Encounter: Payer: Medicare PPO | Admitting: Occupational Therapy

## 2023-12-20 ENCOUNTER — Encounter: Payer: Self-pay | Admitting: Occupational Therapy

## 2023-12-20 ENCOUNTER — Ambulatory Visit: Payer: Medicare PPO | Admitting: Occupational Therapy

## 2023-12-20 DIAGNOSIS — I890 Lymphedema, not elsewhere classified: Secondary | ICD-10-CM | POA: Diagnosis not present

## 2023-12-20 NOTE — Therapy (Signed)
OUTPATIENT OCCUPATIONAL THERAPY TREATMENT NOTE  LOWER EXTREMITY LYMPHEDEMA   Patient Name: Hayley Barr MRN: [id number] DOB:11-Jun-1956, 68 y.o., female Today's Date: 12/20/2023  END OF SESSION:   OT End of Session - 12/20/23 1013     Visit Number 8    Number of Visits 36    Date for OT Re-Evaluation 01/27/24    OT Start Time 1005    OT Stop Time 1105    OT Time Calculation (min) 60 min    Activity Tolerance Patient tolerated treatment well;No increased pain    Behavior During Therapy [facility] for tasks assessed/performed             History reviewed. No pertinent past medical history. History reviewed. No pertinent surgical history. Patient Active Problem List   Diagnosis Date Noted   Foot pain, right 05/15/2022   Benign essential hypertension 09/02/2020   Diabetes (HCC) 09/02/2020   Dyslipidemia, goal LDL below 100 09/02/2020   Esophageal reflux 09/02/2020   Irritable bowel syndrome without diarrhea 09/02/2020   Primary osteoarthritis of knees, bilateral 09/02/2020   Psoriasis 09/02/2020   Vitamin D deficiency 09/02/2020   Abscess and cellulitis 02/08/2015   Recurrent umbilical hernia 10/23/2014   Hair loss 09/10/2014   Ventral hernia 09/10/2014    PCP: Jacinto Reap, MD  REFERRING PROVIDER: Alvester Morin, MD  REFERRING DIAG: 189.0  THERAPY DIAG:  Lymphedema, not elsewhere classified  Rationale for Evaluation and Treatment: Rehabilitation  ONSET DATE: 1.5 yrs after an episode of shingles  SUBJECTIVE:                                                                                                                                                                                           SUBJECTIVE STATEMENT: [name] presents to Occupational Therapy for initial treatment session for BLE lymphedema. Pt denies LE lymphedema-related pain today. She tells me she was diligent with compression wraps to R leg. Pt presents with wraps in place. She  reports she ordered compression garments over the phone earlier in the week/   PERTINENT HISTORY: HTN, OA, DM type 2, pre-existing B leg and foot lymphedema 2/2 suspected venous insufficiency, R>L. R leg Fx with reconstruction of Achilles;  chronic plantar  fasciitis, Negative for DVT in 1923, Hx R foot abscess and cellulitis, IBS, Psoriasis, bilateral neuropathy feet and legs, currently has PT 2 x weekly   PAIN:  Are you having pain? no, L leg and knee leg pain not rated/10 Location: B legs, feet and ankles, R>L Description: heaviness, stiffness, tightness What makes it better? elevation What makes it worse? Standing, walking, dependent sitting  PRECAUTIONS: Fall Risk;  Infection Risk, LYMPHEDEMA precautions, DM type 2 precautions  WEIGHT BEARING RESTRICTIONS: No  FALLS:  Has patient fallen in last 6 months? Yes. 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  COGNITION:  Overall cognitive status: Within functional limits for tasks assessed   OBSERVATIONS / OTHER ASSESSMENTS:  Mild-moderate, BLE lymphedema 2/2 suspected venous insufficiency and obesity ( weight induced lymphedema.  POSTURE: WFL  LE ROM: WFL for  clinical tasks  LE MMT: WFL for clinical task  LYMPHEDEMA ASSESSMENTS:   SURGERY TYPE/DATE: N/A, non cancer related  Hx INFECTIONS: NONE     Hx WOUNDS: currently has a dime sized non healing  wound at base of R heel incision from achilles reconstruction   FOTO functional outcome measure: Initial 09/29/23: 50%  Lymphedema Life Impact Scale (LLIS) Initial 09/29/23: 47.06%  BLE COMPARATIVE LIMB VOLUMETRICS:   11/04/23 Initial  LANDMARK RIGHT   R LEG (A-D) 6686.8 ml  R THIGH (E-G) ml  R FULL LIMB (A-G) ml  Limb Volume differential (LVD)  0.5 %, L>R  Volume change since initial %  Volume change overall V  (Blank rows = not tested)  LANDMARK LEFT   L LEG (A-D) 6743.7 ml  L THIGH (E-G) ml  L FULL LIMB (A-G) ml  Limb Volume differential (LVD)  %  Volume change since initial %  Volume change overall %   (Blank rows = not tested)  6th visit RLE 12/10/23  LANDMARK RIGHT   R LEG (A-D) 5451.6 ml  R THIGH (E-G) ml  R FULL LIMB (A-G) ml  Limb Volume differential (LVD)  %  Volume change since initial  DEC 18.47% since 11/04/23  Volume change overall V  (Blank rows = not tested) GAIT: Distance walked: arrived in transport wc. Able to transfer to Rx bed and back to transport wc using Single point cane (modified independence)  TODAY'S TREATMENT:                                                                                                                                         RLE/RLQ MLD as established using functional inguinal LN  in supine Pt/ family education re  LE self-care home program- simple self MLD Multilayer compression wraps  from toes to knee on R  PATIENT EDUCATION: Simple self-MLD handout given. Continued Pt/ CG edu for lymphedema self care home program  throughout session. Topics include outcome of comparative limb volumetrics- starting limb volume differentials (LVDs), technology and gradient techniques used for short stretch, multilayer compression wrapping, simple self-MLD, therapeutic lymphatic pumping exercises, skin/nail care, LE precautions,. compression garment recommendations and specifications, wear and care schedule and compression garment donning /  doffing w assistive devices. Discussed progress towards all OT goals since commencing CDT. All questions answered to the Pt's satisfaction. Good return. Person educated: Patient and family Education method: Explanation, Demonstration, and Handouts Education comprehension: verbalized understanding, returned demonstration, verbal cues required, and needs further education  HOME EXERCISE PROGRAM: BLE lymphatic pumping there ex using- 1 set of 10 reps, each exercise in order-  1-2 x daily, bilaterally Simple self MLD 1 x daily Daily skin care and inspection to increase hydration, skin mobility and decrease infection risk- can be done during MLD Compression wraps 23/7 until garment fitting complete (multilayer compression wraps to R LEG using 1 each of 8, 10, and 12 cm wide short stretch compression wraps over single layer of stockinett and Rosidal Soft foam ( 0.4 cm thick) . Finished wraps with stretch net to keep tape in place. )  ASSESSMENT: CLINICAL IMPRESSION: Provided Pt edu throughout session for performance of simple self-MLD. Continued RLE/RLQ MLD utilizing short neck sequence, deep abdominal breathing, functional inguinal lymph nodes, and proximal to distal J strokes to RLE. Reversed direction for last 3 reps from distal to proximal.   Reapplied compression wraps as established. R leg decongestion has reached a clinical plateau. Plan to fit compression garment ASAP. Cont as per POC.    (10/29/23 INITIAL EVALUATION:  Pahola Millington is a 68 yo female presenting with mild, stage 2, BLE lymphedema, R>L.  Longstanding, mild, B ankle swelling is exacerbated for the past 1.5 years s/P an episode of shingles affecting the R leg, and a subsequent fall resulting in ankle fracture with Achilles reconstruction and ongoing, non-healing wound at the distal end of surgical scar. This situation is concerning re elevated infection risk from lymphatic dysfunction n the context of DM type II,  and ongoing  functional decline due to associated problems with ambulation and functional mobility.  Lymphedema occurs secondary to orthopedic trauma when the injury damages lymphatic vessels and structures in the area, leading to a buildup of fluid carrying cellular debris, waste products, bacteria, viruses, and damaged or abnormal cells in the tissues causing protein-rich swelling in the affected body part. This secondary lymphedema, often arising from significant soft tissue trauma accompanying the fracture, can potentially delay wound healing and complicate fracture management.   Progressing BLE lymphedema limits Ms Scroggin's functional performance in all occupational domains, including functional ambulation and transfers, standing tolerance, dependent sitting for more than 15 minutes,  basic and instrumental ADLs performance, including lower body dressing, LB bathing, fitting preferred street shoes and LB clothing; driving, shopping, and home management . Lymphedema and associated pain limits Pt's ability to perform productive activities and  leisure pursuits and to participate in socialization at home and in the community.  BLE lymphedema contributes to elevated infection risk and increased falls risk due to body asymmetry. Ms Pharris will benefit from skilled OT for Complete Decongestive Therapy (CDT) the gold standard of lymphedema care, including manual lymphatic drainage (MLD), skin care to limit infection risk and increase skin excursion, lymphatic pumping exercise, and during the Intensive Phase multilayer, gradient compression bandaging to reduce limb volume. Once limb volume reduction reaches a clinical plateau custom compression garments that provide appropriate fit, compression and containment are fitted.  Throughout the treatment course Pt/ caregiver will learn lymphedema prevention strategies and precautions, learn to perform all LE self-care home program components. Without skilled OT for lymphedema  care, Pt's condition will progress and further functional decline is expected.)    OBJECTIVE IMPAIRMENTS: Abnormal gait, decreased activity tolerance, decreased balance, decreased knowledge of condition, decreased knowledge of use of DME, decreased mobility, difficulty walking, decreased ROM, decreased strength, increased edema, impaired flexibility, impaired sensation, improper body mechanics, obesity, pain, and chronic , progressive, BLE swelling with related tissue changes and thickening, increased infection risk, non-healing wound .   ACTIVITY LIMITATIONS: Difficulty performing basic and instrumental ADLs (LB dressing, LB bathing,bathing, toileting, dressing, hygiene/grooming, and functional ambulation. Difficulty performing leisure pursuits and productive activities requiring standing and/ or walking for > 19 minutes  PARTICIPATION LIMITATIONS:  Impaired social participation, decreased participation in church  PERSONAL FACTORS: Behavior pattern, Fitness, Past/current experiences, Social background, Time since onset of injury/illness/exacerbation, and 3+ comorbidities: OA, hx R ankle fracture and non-healing wound, hx plantar fasciitis  are also affecting patient's functional outcome.   REHAB POTENTIAL: Good  CLINICAL DECISION MAKING: Evolving/moderate complexity  EVALUATION COMPLEXITY: Moderate   GOALS: Goals reviewed with patient? Yes  SHORT TERM GOALS: Target date: 4th OT Rx visit   Pt will demonstrate understanding of lymphedema precautions and prevention strategies with modified independence using a printed reference to identify at least 5 precautions and discussing how s/he may implement them into daily life to reduce risk of progression with extra time. Baseline:Max A Goal status: PROGRESSING  2.  Pt will be able to apply multilayer, knee length, gradient, compression wraps to one leg at a time from toes to below knee with modified independence (extra time) to decrease limb  volume, to limit infection risk, and to limit lymphedema progression.  Baseline: Dependent Goal status: MET  LONG TERM GOALS: Target date: 02/18/24  Given this patient's Intake score of 50 % on the functional outcomes FOTO tool, patient will experience an increase in function of 5 points to improve basic and instrumental ADLs performance, including lymphedema self-care.  Baseline: 50 Goal status: INITIAL  2.  Given this patient's Intake score of 47.06 % on the Lymphedema Life Impact Scale (LLIS), patient will experience a reduction of at least 5 points in her perceived level of functional impairment resulting from lymphedema to improve functional performance and quality of life (QOL). Baseline: 47.06 % Goal status: INITIAL  3.  Pt will achieve at least a 10% volume reduction in B legs to return limb to typical size and shape, to limit infection risk and LE progression, to decrease pain, to improve function. Baseline: Dependent Goal status: Progressing with R LEG volume decreased by 18.47% in 6 visits to date.  4.  Pt will obtain appropriate compression garments/devices and achieve modified independence (extra time + assistive devices) with donning/doffing to optimize limb volume reductions and limit LE progression over time. Baseline: Dependent Goal status: INITIAL  During Intensive phase CDT , with modified independence, Pt will achieve at least 85% compliance with all lymphedema self-care home program components, including daily skin care, compression wraps and /or garments, simple self MLD and lymphatic pumping therex to habituate LE self care protocol  into ADLs for optimal LE self-management over time. Baseline: Dependent Goal status:PROGRESSING  PLAN:  OT FREQUENCY: 2x/week  OT DURATION: 12 weeks and PRN  PLANNED INTERVENTIONS: 97110-Therapeutic exercises, 97530- Therapeutic activity, 97140- Manual therapy, Dry Needling, Manual lymph drainage, and Compression bandaging Complete  Decongestive Therapy: Manual lympathic drainage,  skin care,    compression wraps,, then fit with appropriate compression garments during Self-management Phase.  Custom-made gradient compression garments and HOS devices are medically necessary because they are uniquely sized and shaped to fit the exact dimensions of the affected extremities, and to provide appropriate medical grade, graduated compression essential for optimally managing chronic, progressive lymphedema. Multiple custom compression garments are needed to ensure proper hygiene to limit infection risk. Custom compression garments should be replaced q 3-6 months When worn consistently for optimal lipo-lymphedema self-management over time. HOS devices, medically necessary to limit fibrosis buildup in tissue, should be replaced q 2 years and PRN when worn out.     PLAN FOR NEXT SESSION:   Pt edu for lymphedema self-ca home program.  Loel Dubonnet, MS, OTR/L, CLT-LANA 12/20/23 1:21 PM

## 2023-12-24 ENCOUNTER — Ambulatory Visit: Payer: Medicare PPO | Admitting: Occupational Therapy

## 2023-12-24 DIAGNOSIS — I890 Lymphedema, not elsewhere classified: Secondary | ICD-10-CM

## 2023-12-24 NOTE — Therapy (Unsigned)
OUTPATIENT OCCUPATIONAL THERAPY TREATMENT NOTE  LOWER EXTREMITY LYMPHEDEMA   Patient Name: Hayley Barr MRN: [id number] DOB:1955-12-07, 68 y.o., female Today's Date: 12/24/2023  END OF SESSION:   OT End of Session - 12/24/23 1300     Visit Number 9    Number of Visits 36    Date for OT Re-Evaluation 01/27/24    OT Start Time 0100    OT Stop Time 0200    OT Time Calculation (min) 60 min    Activity Tolerance Patient tolerated treatment well;No increased pain    Behavior During Therapy [facility] for tasks assessed/performed             No past medical history on file. No past surgical history on file. Patient Active Problem List   Diagnosis Date Noted   Foot pain, right 05/15/2022   Benign essential hypertension 09/02/2020   Diabetes (HCC) 09/02/2020   Dyslipidemia, goal LDL below 100 09/02/2020   Esophageal reflux 09/02/2020   Irritable bowel syndrome without diarrhea 09/02/2020   Primary osteoarthritis of knees, bilateral 09/02/2020   Psoriasis 09/02/2020   Vitamin D deficiency 09/02/2020   Abscess and cellulitis 02/08/2015   Recurrent umbilical hernia 10/23/2014   Hair loss 09/10/2014   Ventral hernia 09/10/2014    PCP: Hayley Reap, MD  REFERRING PROVIDER: Alvester Morin, MD  REFERRING DIAG: 189.0  THERAPY DIAG:  Lymphedema, not elsewhere classified  Rationale for Evaluation and Treatment: Rehabilitation  ONSET DATE: 1.5 yrs after an episode of shingles  SUBJECTIVE:                                                                                                                                                                                           SUBJECTIVE STATEMENT: Hayley Barr presents to Occupational Therapy for for BLE lymphedema. Pt denies LE lymphedema-related pain today. She tells me she was diligent with compression wraps to R leg. Pt presents with wraps in place. She reports she ordered compression garments over the phone earlier  in the week/   PERTINENT HISTORY: HTN, OA, DM type 2, pre-existing B leg and foot lymphedema 2/2 suspected venous insufficiency, R>L. R leg Fx with reconstruction of Achilles;  chronic plantar  fasciitis, Negative for DVT in 1923, Hx R foot abscess and cellulitis, IBS, Psoriasis, bilateral neuropathy feet and legs, currently has PT 2 x weekly   PAIN:  Are you having pain? no, L leg and knee leg pain not rated/10 Location: B legs, feet and ankles, R>L Description: heaviness, stiffness, tightness What makes it better? elevation What makes it worse? Standing, walking, dependent sitting  PRECAUTIONS: Fall Risk;  Infection Risk, LYMPHEDEMA  precautions, DM type 2 precautions  WEIGHT BEARING RESTRICTIONS: No  FALLS:  Has patient fallen in last 6 months? Yes. 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  COGNITION:  Overall cognitive status: Within functional limits for tasks assessed   OBSERVATIONS / OTHER ASSESSMENTS:  Mild-moderate, BLE lymphedema 2/2 suspected venous insufficiency and obesity ( weight induced lymphedema.  POSTURE: WFL  LE ROM: WFL for  clinical tasks  LE MMT: WFL for clinical task  LYMPHEDEMA ASSESSMENTS:   SURGERY TYPE/DATE: N/A, non cancer related  Hx INFECTIONS: NONE     Hx WOUNDS: currently has a dime sized non healing wound at base of R heel incision from achilles reconstruction    FOTO functional outcome measure: Initial 09/29/23: 50%  Lymphedema Life Impact Scale (LLIS) Initial 09/29/23: 47.06%  BLE COMPARATIVE LIMB VOLUMETRICS:   11/04/23 Initial  LANDMARK RIGHT   R LEG (A-D) 6686.8 ml  R THIGH (E-G) ml  R FULL LIMB (A-G) ml  Limb Volume differential (LVD)  0.5 %, L>R  Volume change since initial %  Volume change overall V  (Blank rows = not tested)  LANDMARK LEFT   L LEG (A-D) 6743.7 ml  L THIGH (E-G) ml  L FULL LIMB (A-G) ml  Limb Volume differential (LVD)  %  Volume change since initial %  Volume change overall %   (Blank rows = not tested)  6th visit RLE 12/10/23  LANDMARK RIGHT   R LEG (A-D) 5451.6 ml  R THIGH (E-G) ml  R FULL LIMB (A-G) ml  Limb Volume differential (LVD)  %  Volume change since initial  DEC 18.47% since 11/04/23  Volume change overall V  (Blank rows = not tested) GAIT: Distance walked: arrived in transport wc. Able to transfer to Rx bed and back to transport wc using Single point cane (modified independence)  TODAY'S TREATMENT:                                                                                                                                         RLE/RLQ MLD as established using functional inguinal LN  in supine Pt/ family education re  LE self-care home program- simple self MLD Multilayer compression wraps  from toes to knee on R  PATIENT EDUCATION: Simple self-MLD handout given. Continued Pt/ CG edu for lymphedema self care home program throughout session. Topics  include outcome of comparative limb volumetrics- starting limb volume differentials (LVDs), technology and gradient techniques used for short stretch, multilayer compression wrapping, simple self-MLD, therapeutic lymphatic pumping exercises, skin/nail care, LE precautions,. compression garment recommendations and specifications, wear and care schedule and compression garment donning / doffing w assistive devices. Discussed progress towards all OT  goals since commencing CDT. All questions answered to the Pt's satisfaction. Good return. Person educated: Patient and family Education method: Explanation, Demonstration, and Handouts Education comprehension: verbalized understanding, returned demonstration, verbal cues required, and needs further education  HOME EXERCISE PROGRAM: BLE lymphatic pumping there ex using- 1 set of 10 reps, each exercise in order-  1-2 x daily, bilaterally Simple self MLD 1 x daily Daily skin care and inspection to increase hydration, skin mobility and decrease infection risk- can be done during MLD Compression wraps 23/7 until garment fitting complete (multilayer compression wraps to R LEG using 1 each of 8, 10, and 12 cm wide short stretch compression wraps over single layer of stockinett and Rosidal Soft foam ( 0.4 cm thick) . Finished wraps with stretch net to keep tape in place. )  ASSESSMENT: CLINICAL IMPRESSION: Provided Pt edu throughout session for performance of simple self-MLD. Continued RLE/RLQ MLD utilizing short neck sequence, deep abdominal breathing, functional inguinal lymph nodes, and proximal to distal J strokes to RLE. Reversed direction for last 3 reps from distal to proximal.   Reapplied compression wraps as established. R leg decongestion has reached a clinical plateau. Plan to fit compression garment ASAP. Cont as per POC.    (10/29/23 INITIAL EVALUATION:  Rashmi Digirolamo is a 68 yo female presenting with mild, stage 2, BLE lymphedema, R>L.  Longstanding, mild, B ankle swelling is exacerbated for the past 1.5 years s/P an episode of shingles affecting the R leg, and a subsequent fall resulting in ankle fracture with Achilles reconstruction and ongoing, non-healing wound at the distal end of surgical scar. This situation is concerning re elevated infection risk from lymphatic dysfunction n the context of DM type II,  and ongoing functional decline due to associated problems with ambulation and  functional mobility.  Lymphedema occurs secondary to orthopedic trauma when the injury damages lymphatic vessels and structures in the area, leading to a buildup of fluid carrying cellular debris, waste products, bacteria, viruses, and damaged or abnormal cells in the tissues causing protein-rich swelling in the affected body part. This secondary lymphedema, often arising from significant soft tissue trauma accompanying the fracture, can potentially delay wound healing and complicate fracture management.   Progressing BLE lymphedema limits Hayley Cherubini's functional performance in all occupational domains, including functional ambulation and transfers, standing tolerance, dependent sitting for more than 15 minutes,  basic and instrumental ADLs performance, including lower body dressing, LB bathing, fitting preferred street shoes and LB clothing; driving, shopping, and home management . Lymphedema and associated pain limits Pt's ability to perform productive activities and  leisure pursuits and to participate in socialization at home and in the community.  BLE lymphedema contributes to elevated infection risk and increased falls risk due to body asymmetry. Hayley Hoyos will benefit from skilled OT for Complete Decongestive Therapy (CDT) the gold standard of lymphedema care, including manual lymphatic drainage (MLD), skin care to limit infection risk and increase skin excursion, lymphatic pumping exercise, and during the Intensive Phase multilayer, gradient compression bandaging to reduce limb volume. Once limb volume reduction reaches a clinical plateau custom compression garments that provide appropriate fit, compression and containment are fitted. Throughout the treatment  course Pt/ caregiver will learn lymphedema prevention strategies and precautions, learn to perform all LE self-care home program components. Without skilled OT for lymphedema care, Pt's condition will progress and further functional decline  is expected.)    OBJECTIVE IMPAIRMENTS: Abnormal gait, decreased activity tolerance, decreased balance, decreased knowledge of condition, decreased knowledge of use of DME, decreased mobility, difficulty walking, decreased ROM, decreased strength, increased edema, impaired flexibility, impaired sensation, improper body mechanics, obesity, pain, and chronic , progressive, BLE swelling with related tissue changes and thickening, increased infection risk, non-healing wound .   ACTIVITY LIMITATIONS: Difficulty performing basic and instrumental ADLs (LB dressing, LB bathing,bathing, toileting, dressing, hygiene/grooming, and functional ambulation. Difficulty performing leisure pursuits and productive activities requiring standing and/ or walking for > 19 minutes  PARTICIPATION LIMITATIONS:  Impaired social participation, decreased participation in church  PERSONAL FACTORS: Behavior pattern, Fitness, Past/current experiences, Social background, Time since onset of injury/illness/exacerbation, and 3+ comorbidities: OA, hx R ankle fracture and non-healing wound, hx plantar fasciitis  are also affecting patient's functional outcome.   REHAB POTENTIAL: Good  CLINICAL DECISION MAKING: Evolving/moderate complexity  EVALUATION COMPLEXITY: Moderate   GOALS: Goals reviewed with patient? Yes  SHORT TERM GOALS: Target date: 4th OT Rx visit   Pt will demonstrate understanding of lymphedema precautions and prevention strategies with modified independence using a printed reference to identify at least 5 precautions and discussing how s/he may implement them into daily life to reduce risk of progression with extra time. Baseline:Max A Goal status: PROGRESSING  2.  Pt will be able to apply multilayer, knee length, gradient, compression wraps to one leg at a time from toes to below knee with modified independence (extra time) to decrease limb volume, to limit infection risk, and to limit lymphedema  progression.  Baseline: Dependent Goal status: MET  LONG TERM GOALS: Target date: 02/18/24  Given this patient's Intake score of 50 % on the functional outcomes FOTO tool, patient will experience an increase in function of 5 points to improve basic and instrumental ADLs performance, including lymphedema self-care.  Baseline: 50 Goal status: INITIAL  2.  Given this patient's Intake score of 47.06 % on the Lymphedema Life Impact Scale (LLIS), patient will experience a reduction of at least 5 points in her perceived level of functional impairment resulting from lymphedema to improve functional performance and quality of life (QOL). Baseline: 47.06 % Goal status: INITIAL  3.  Pt will achieve at least a 10% volume reduction in B legs to return limb to typical size and shape, to limit infection risk and LE progression, to decrease pain, to improve function. Baseline: Dependent Goal status: Progressing with R LEG volume decreased by 18.47% in 6 visits to date.  4.  Pt will obtain appropriate compression garments/devices and achieve modified independence (extra time + assistive devices) with donning/doffing to optimize limb volume reductions and limit LE progression over time. Baseline: Dependent Goal status: INITIAL  During Intensive phase CDT , with modified independence, Pt will achieve at least 85% compliance with all lymphedema self-care home program components, including daily skin care, compression wraps and /or garments, simple self MLD and lymphatic pumping therex to habituate LE self care protocol  into ADLs for optimal LE self-management over time. Baseline: Dependent Goal status:PROGRESSING  PLAN:  OT FREQUENCY: 2x/week  OT DURATION: 12 weeks and PRN  PLANNED INTERVENTIONS: 97110-Therapeutic exercises, 97530- Therapeutic activity, 97140- Manual therapy, Dry Needling, Manual lymph drainage, and Compression bandaging Complete Decongestive Therapy: Manual lympathic drainage, skin  care,  compression wraps,, then fit with appropriate compression garments during Self-management Phase.  Custom-made gradient compression garments and HOS devices are medically necessary because they are uniquely sized and shaped to fit the exact dimensions of the affected extremities, and to provide appropriate medical grade, graduated compression essential for optimally managing chronic, progressive lymphedema. Multiple custom compression garments are needed to ensure proper hygiene to limit infection risk. Custom compression garments should be replaced q 3-6 months When worn consistently for optimal lipo-lymphedema self-management over time. HOS devices, medically necessary to limit fibrosis buildup in tissue, should be replaced q 2 years and PRN when worn out.     PLAN FOR NEXT SESSION:   Pt edu for lymphedema self-ca home program.  Loel Dubonnet, Hayley, OTR/L, CLT-LANA 12/24/23 2:09 PM

## 2023-12-25 ENCOUNTER — Ambulatory Visit: Payer: Medicare PPO | Admitting: Occupational Therapy

## 2023-12-31 ENCOUNTER — Ambulatory Visit: Payer: Medicare PPO | Attending: Cardiothoracic Surgery | Admitting: Occupational Therapy

## 2023-12-31 DIAGNOSIS — I890 Lymphedema, not elsewhere classified: Secondary | ICD-10-CM | POA: Insufficient documentation

## 2023-12-31 NOTE — Therapy (Signed)
 OUTPATIENT OCCUPATIONAL THERAPY  PROGRESS REPORT and TREATMENT NOTE  BILATERAL LOWER EXTREMITY LYMPHEDEMA   Patient Name: Hayley Barr MRN: [id number] DOB:1956-01-30, 68 y.o., female Today's Date: 12/31/2023  END OF SESSION:   OT End of Session - 12/31/23 1314     Visit Number 10    Number of Visits 36    Date for OT Re-Evaluation 01/27/24    OT Start Time 0105    OT Stop Time 0205    OT Time Calculation (min) 60 min    Activity Tolerance Patient tolerated treatment well;No increased pain    Behavior During Therapy [facility] for tasks assessed/performed             No past medical history on file. No past surgical history on file. Patient Active Problem List   Diagnosis Date Noted   Foot pain, right 05/15/2022   Benign essential hypertension 09/02/2020   Diabetes (HCC) 09/02/2020   Dyslipidemia, goal LDL below 100 09/02/2020   Esophageal reflux 09/02/2020   Irritable bowel syndrome without diarrhea 09/02/2020   Primary osteoarthritis of knees, bilateral 09/02/2020   Psoriasis 09/02/2020   Vitamin D deficiency 09/02/2020   Abscess and cellulitis 02/08/2015   Recurrent umbilical hernia 10/23/2014   Hair loss 09/10/2014   Ventral hernia 09/10/2014    PCP: Dorenda Mormon, MD  REFERRING PROVIDER: Bert Damita Skene, MD  REFERRING DIAG: 189.0  THERAPY DIAG:  Lymphedema, not elsewhere classified  Rationale for Evaluation and Treatment: Rehabilitation  ONSET DATE: 1.5 yrs after an episode of shingles  SUBJECTIVE:                                                                                                                                                                                           SUBJECTIVE STATEMENT: [name] presents to Occupational Therapy for BLE lymphedema. Pt denies LE lymphedema-related pain today. She tells me she was diligent with compression wraps to R leg. Pt presents with wraps in place. She reports she has noticed some foot  swelling when wearing her new custom stocking fitted last time.   PERTINENT HISTORY: HTN, OA, DM type 2, pre-existing B leg and foot lymphedema 2/2 suspected venous insufficiency, R>L. R leg Fx with reconstruction of Achilles;  chronic plantar  fasciitis, Negative for DVT in 1923, Hx R foot abscess and cellulitis, IBS, Psoriasis, bilateral neuropathy feet and legs, currently has PT 2 x weekly   PAIN:  Are you having pain? no, L leg and knee leg pain not rated/10 Location: B legs, feet and ankles, R>L Description: heaviness, stiffness, tightness What makes it better? elevation What makes it worse? Standing, walking, dependent sitting  PRECAUTIONS: Fall Risk;  Infection Risk, LYMPHEDEMA precautions, DM type 2 precautions  WEIGHT BEARING RESTRICTIONS: No  FALLS:  Has patient fallen in last 6 months? Yes. Number of falls 1  LIVING ENVIRONMENT: Lives with: lives alone Lives in: House/apartment Stairs: Yes; Internal: 16 steps; on left going up Has following equipment at home: Quad cane small base, Environmental Consultant - 4 wheeled, Wheelchair (manual), shower chair, and Grab bars  OCCUPATION: retired orthoptist at [employer]: reading, working out on recumbent bike x weekly  HAND DOMINANCE: right   PRIOR LEVEL OF FUNCTION: Independent  PATIENT GOALS: I want to return to activities, walking, cooking for myself, traveling, going to church   OBJECTIVE: Note: Objective measures were completed at Evaluation unless otherwise noted.  COGNITION:  Overall cognitive status: Within functional limits for tasks assessed   OBSERVATIONS / OTHER ASSESSMENTS:  Mild-moderate, BLE lymphedema 2/2 suspected venous insufficiency and obesity ( weight induced lymphedema.  POSTURE: WFL  LE ROM: WFL for  clinical tasks  LE MMT: WFL for clinical task  LYMPHEDEMA ASSESSMENTS:   SURGERY TYPE/DATE: N/A, non cancer related  Hx INFECTIONS: NONE     Hx WOUNDS: currently has a dime sized non healing wound at base of  R heel incision from achilles reconstruction   FOTO functional outcome measure: Initial 09/29/23: 50%  Lymphedema Life Impact Scale (LLIS) Initial 09/29/23: 47.06%  BLE COMPARATIVE LIMB VOLUMETRICS:   11/04/23 Initial  LANDMARK RIGHT   R LEG (A-D) 6686.8 ml  R THIGH (E-G) ml  R FULL LIMB (A-G) ml  Limb Volume differential (LVD)  0.5 %, L>R  Volume change since initial %  Volume change overall V  (Blank rows = not tested)  LANDMARK LEFT   L LEG (A-D) 6743.7 ml  L THIGH (E-G) ml  L FULL LIMB (A-G) ml  Limb Volume differential (LVD)  %  Volume change since initial %  Volume change overall %   (Blank rows = not tested)  6th visit RLE 12/10/23  LANDMARK RIGHT   R LEG (A-D) 5451.6 ml  R THIGH (E-G) ml  R FULL LIMB (A-G) ml  Limb Volume differential (LVD)  %  Volume change since initial  DEC 18.47% since 11/04/23  Volume change overall V  (Blank rows = not tested)   9th visit RLE 12/24/23  [facility] RIGHT   R LEG (A-D) 5451.6 ml  R THIGH (E-G) ml  R FULL LIMB (A-G) ml  Limb Volume differential (LVD)  %  Volume change since initial DECREASED 22.4% overall  Volume change since 12/10/23 DECREASED 4.8%  (Blank rows = not tested) GAIT: Distance walked: arrived in transport wc. Able to transfer to Rx bed and back to transport wc using Single point cane (modified independence)  TODAY'S TREATMENT:  RLE volumetrics Initial RLE custom garment fitting Pt/ family education re  LE self-care donning/ doffing, wear, care of and precautions for custom compression garments/ devices Multilayer compression wraps  from toes to knee on L  PATIENT EDUCATION: Simple self-MLD handout given. Continued Pt/ CG edu for lymphedema self care home program throughout session. Topics include outcome of comparative limb volumetrics- starting limb volume differentials  (LVDs), technology and gradient techniques used for short stretch, multilayer compression wrapping, simple self-MLD, therapeutic lymphatic pumping exercises, skin/nail care, LE precautions,. compression garment recommendations and specifications, wear and care schedule and compression garment donning / doffing w assistive devices. Discussed progress towards all OT goals since commencing CDT. All questions answered to the Pt's satisfaction. Good return. Person educated: Patient and family Education method: Explanation, Demonstration, and Handouts Education comprehension: verbalized understanding, returned demonstration, verbal cues required, and needs further education  HOME EXERCISE PROGRAM: BLE lymphatic pumping there ex using- 1 set of 10 reps, each exercise in order-  1-2 x daily, bilaterally Simple self MLD 1 x daily Daily skin care and inspection to increase hydration, skin mobility and decrease infection risk- can be done during MLD Compression wraps 23/7 until garment fitting complete (multilayer compression wraps to R LEG using 1 each of 8, 10, and 12 cm wide short stretch compression wraps over single layer of stockinett and Rosidal Soft foam ( 0.4 cm thick) . Finished wraps with stretch net to keep tape in place. )  ASSESSMENT: CLINICAL IMPRESSION: Pt demonstrates ongoing response to CDT and steady progress towards all OT goals. Comparative limb volumetrics reveal an additional 4.8% volume reduction in the R leg since last measured on 1/14, and an overall R leg volume reduction of 22.4% since commencing OT. This value meets and exceeds the 10% reduction goal for the RLE.  Initial custom RLE compression garment, fit last week, is comfortable and Pt is able to don and doff it with modified independence (extra time). Because Pt feels toes on R foot remain swollen, we will complete measurements for a compression toe cap at next visit. Because these are very costly garments we typically try open toe  stockings without a toe cap initially. Clearly she will benefit from a R one. Pt and OT reviewed each goal and discussed progress and obstacles. Pt continues to perform all LE self care daily between visits as instructed. Please review GOALS section for details.  LLE response to CDT is somewhat slower and less dramatic compared with the RLE, but then the RLE played is more progressed and entrenched 2/2 orthopedic trauma. We applied LLE wraps as established at end of session. Cont as per POC.   (10/29/23 INITIAL EVALUATION:  Asta Dundon is a 68 yo female presenting with mild, stage 2, BLE lymphedema, R>L.  Longstanding, mild, B ankle swelling is exacerbated for the past 1.5 years s/P an episode of shingles affecting the R leg, and a subsequent fall resulting in ankle fracture with Achilles reconstruction and ongoing, non-healing wound at the distal end of surgical scar. This situation is concerning re elevated infection risk from lymphatic dysfunction n the context of DM type II,  and ongoing functional decline due to associated problems with ambulation and functional mobility.  Lymphedema occurs secondary to orthopedic trauma when the injury damages lymphatic vessels and structures in the area, leading to a buildup of fluid carrying cellular debris, waste products, bacteria, viruses, and damaged or abnormal cells in the tissues causing protein-rich swelling in the affected body part. This secondary lymphedema,  often arising from significant soft tissue trauma accompanying the fracture, can potentially delay wound healing and complicate fracture management.   Progressing BLE lymphedema limits Ms Tupper's functional performance in all occupational domains, including functional ambulation and transfers, standing tolerance, dependent sitting for more than 15 minutes,  basic and instrumental ADLs performance, including lower body dressing, LB bathing, fitting preferred street shoes and LB clothing;  driving, shopping, and home management . Lymphedema and associated pain limits Pt's ability to perform productive activities and  leisure pursuits and to participate in socialization at home and in the community.  BLE lymphedema contributes to elevated infection risk and increased falls risk due to body asymmetry. Ms Muecke will benefit from skilled OT for Complete Decongestive Therapy (CDT) the gold standard of lymphedema care, including manual lymphatic drainage (MLD), skin care to limit infection risk and increase skin excursion, lymphatic pumping exercise, and during the Intensive Phase multilayer, gradient compression bandaging to reduce limb volume. Once limb volume reduction reaches a clinical plateau custom compression garments that provide appropriate fit, compression and containment are fitted. Throughout the treatment course Pt/ caregiver will learn lymphedema prevention strategies and precautions, learn to perform all LE self-care home program components. Without skilled OT for lymphedema care, Pt's condition will progress and further functional decline is expected.)    OBJECTIVE IMPAIRMENTS: Abnormal gait, decreased activity tolerance, decreased balance, decreased knowledge of condition, decreased knowledge of use of DME, decreased mobility, difficulty walking, decreased ROM, decreased strength, increased edema, impaired flexibility, impaired sensation, improper body mechanics, obesity, pain, and chronic , progressive, BLE swelling with related tissue changes and thickening, increased infection risk, non-healing wound .   ACTIVITY LIMITATIONS: Difficulty performing basic and instrumental ADLs (LB dressing, LB bathing,bathing, toileting, dressing, hygiene/grooming, and functional ambulation. Difficulty performing leisure pursuits and productive activities requiring standing and/ or walking for > 19 minutes  PARTICIPATION LIMITATIONS:  Impaired social participation, decreased participation in  church  PERSONAL FACTORS: Behavior pattern, Fitness, Past/current experiences, Social background, Time since onset of injury/illness/exacerbation, and 3+ comorbidities: OA, hx R ankle fracture and non-healing wound, hx plantar fasciitis  are also affecting patient's functional outcome.   REHAB POTENTIAL: Good  CLINICAL DECISION MAKING: Evolving/moderate complexity  EVALUATION COMPLEXITY: Moderate   GOALS: Goals reviewed with patient? Yes  SHORT TERM GOALS: Target date: 4th OT Rx visit   Pt will demonstrate understanding of lymphedema precautions and prevention strategies with modified independence using a printed reference to identify at least 5 precautions and discussing how s/he may implement them into daily life to reduce risk of progression with extra time. Baseline:Max A Goal status: GOAL MET  2.  Pt will be able to apply multilayer, knee length, gradient, compression wraps to one leg at a time from toes to below knee with modified independence (extra time) to decrease limb volume, to limit infection risk, and to limit lymphedema progression.  Baseline: Dependent Goal status: GOAL MET  LONG TERM GOALS: Target date: 02/18/24  Given this patient's Intake score of 50 % on the functional outcomes FOTO tool, patient will experience an increase in function of 5 points to improve basic and instrumental ADLs performance, including lymphedema self-care.  Baseline: 50 Goal status: INITIAL. 12/25/23 GOAL DEFERRED. FOTO tool discontinued.  2.  Given this patient's Intake score of 47.06 % on the Lymphedema Life Impact Scale (LLIS), patient will experience a reduction of at least 5 points in her perceived level of functional impairment resulting from lymphedema to improve functional performance and quality of life (QOL). Baseline:  47.06 % Goal status: PROGRESSING  3.  Pt will achieve at least a 10% volume reduction in B legs to return limb to typical size and shape, to limit infection risk and  LE progression, to decrease pain, to improve function. Baseline: Dependent Goal status: Achieved w R LEG w/ overall reduction to date of 22.4%.  4.  Pt will obtain appropriate compression garments/devices and achieve modified independence (extra time + assistive devices) with donning/doffing to optimize limb volume reductions and limit LE progression over time. Baseline: Dependent Goal status: PARTIALLY MET . Fitted RLE garments last session.   During Intensive phase CDT , with modified independence, Pt will achieve at least 85% compliance with all lymphedema self-care home program components, including daily skin care, compression wraps and /or garments, simple self MLD and lymphatic pumping therex to habituate LE self care protocol  into ADLs for optimal LE self-management over time. Baseline: Dependent Goal status:PROGRESSING  PLAN:  OT FREQUENCY: 2x/week  OT DURATION: 12 weeks and PRN  PLANNED INTERVENTIONS: 97110-Therapeutic exercises, 97530- Therapeutic activity, 97140- Manual therapy, Dry Needling, Manual lymph drainage, and Compression bandaging Complete Decongestive Therapy: Manual lympathic drainage, skin care,    compression wraps,, then fit with appropriate compression garments during Self-management Phase.  Custom-made gradient compression garments and HOS devices are medically necessary because they are uniquely sized and shaped to fit the exact dimensions of the affected extremities, and to provide appropriate medical grade, graduated compression essential for optimally managing chronic, progressive lymphedema. Multiple custom compression garments are needed to ensure proper hygiene to limit infection risk. Custom compression garments should be replaced q 3-6 months When worn consistently for optimal lipo-lymphedema self-management over time. HOS devices, medically necessary to limit fibrosis buildup in tissue, should be replaced q 2 years and PRN when worn out.     PLAN FOR NEXT  SESSION:   Pt edu for lymphedema self-ca home program.  Zebedee Dec, MS, OTR/L, CLT-LANA 12/31/23 2:08 PM

## 2024-01-03 ENCOUNTER — Ambulatory Visit: Payer: Medicare PPO | Admitting: Occupational Therapy

## 2024-01-03 DIAGNOSIS — I890 Lymphedema, not elsewhere classified: Secondary | ICD-10-CM | POA: Diagnosis not present

## 2024-01-03 NOTE — Therapy (Signed)
 OUTPATIENT OCCUPATIONAL THERAPY TREATMENT NOTE  BILATERAL LOWER EXTREMITY LYMPHEDEMA   Patient Name: Hayley Barr MRN: [id number] DOB:[birth year], 68 y.o., female Today's Date: 01/03/2024  END OF SESSION:   OT End of Session - 01/03/24 0920     Visit Number 11    Number of Visits 36    Date for OT Re-Evaluation 01/27/24    OT Start Time 0910    OT Stop Time 1010    OT Time Calculation (min) 60 min    Activity Tolerance Patient tolerated treatment well;No increased pain    Behavior During Therapy [facility] for tasks assessed/performed             No past medical history on file. No past surgical history on file. Patient Active Problem List   Diagnosis Date Noted   Foot pain, right 05/15/2022   Benign essential hypertension 09/02/2020   Diabetes (HCC) 09/02/2020   Dyslipidemia, goal LDL below 100 09/02/2020   Esophageal reflux 09/02/2020   Irritable bowel syndrome without diarrhea 09/02/2020   Primary osteoarthritis of knees, bilateral 09/02/2020   Psoriasis 09/02/2020   Vitamin D deficiency 09/02/2020   Abscess and cellulitis 02/08/2015   Recurrent umbilical hernia 10/23/2014   Hair loss 09/10/2014   Ventral hernia 09/10/2014    PCP: Dorenda Mormon, MD  REFERRING PROVIDER: Bert Damita Skene, MD  REFERRING DIAG: 189.0  THERAPY DIAG:  Lymphedema, not elsewhere classified  Rationale for Evaluation and Treatment: Rehabilitation  ONSET DATE: 1.5 yrs after an episode of shingles  SUBJECTIVE:                                                                                                                                                                                           SUBJECTIVE STATEMENT: Ms Nace presents to Occupational Therapy for BLE lymphedema. Pt denies LE lymphedema-related pain today. She tells me she was diligent with compression wraps to R leg. Pt presents with wraps in place.    PERTINENT HISTORY: HTN, OA, DM type 2, pre-existing B  leg and foot lymphedema 2/2 suspected venous insufficiency, R>L. R leg Fx with reconstruction of Achilles;  chronic plantar  fasciitis, Negative for DVT in 1923, Hx R foot abscess and cellulitis, IBS, Psoriasis, bilateral neuropathy feet and legs, currently has PT 2 x weekly   PAIN:  Are you having pain? no, L leg and knee leg pain not rated/10 Location: B legs, feet and ankles, R>L Description: heaviness, stiffness, tightness What makes it better? elevation What makes it worse? Standing, walking, dependent sitting  PRECAUTIONS: Fall Risk;  Infection Risk, LYMPHEDEMA precautions, DM type 2 precautions  WEIGHT BEARING RESTRICTIONS: No  FALLS:  Has patient fallen in last 6 months? Yes. Number of falls 1  LIVING ENVIRONMENT: Lives with: lives alone Lives in: House/apartment Stairs: Yes; Internal: 16 steps; on left going up Has following equipment at home: Quad cane small base, Environmental Consultant - 4 wheeled, Wheelchair (manual), shower chair, and Grab bars  OCCUPATION: retired orthoptist at [employer]: reading, working out on recumbent bike x weekly  HAND DOMINANCE: right   PRIOR LEVEL OF FUNCTION: Independent  PATIENT GOALS: I want to return to activities, walking, cooking for myself, traveling, going to church   OBJECTIVE: Note: Objective measures were completed at Evaluation unless otherwise noted.  COGNITION:  Overall cognitive status: Within functional limits for tasks assessed   OBSERVATIONS / OTHER ASSESSMENTS:  Mild-moderate, BLE lymphedema 2/2 suspected venous insufficiency and obesity ( weight induced lymphedema.  POSTURE: WFL  LE ROM: WFL for  clinical tasks  LE MMT: WFL for clinical task  LYMPHEDEMA ASSESSMENTS:   SURGERY TYPE/DATE: N/A, non cancer related  Hx INFECTIONS: NONE     Hx WOUNDS: currently has a dime sized non healing wound at base of R heel incision from achilles reconstruction   FOTO functional outcome measure: Initial 09/29/23: 50%  Lymphedema Life  Impact Scale (LLIS) Initial 09/29/23: 47.06%  BLE COMPARATIVE LIMB VOLUMETRICS:   11/04/23 Initial  LANDMARK RIGHT   R LEG (A-D) 6686.8 ml  R THIGH (E-G) ml  R FULL LIMB (A-G) ml  Limb Volume differential (LVD)  0.5 %, L>R  Volume change since initial %  Volume change overall V  (Blank rows = not tested)  LANDMARK LEFT   L LEG (A-D) 6743.7 ml  L THIGH (E-G) ml  L FULL LIMB (A-G) ml  Limb Volume differential (LVD)  %  Volume change since initial %  Volume change overall %   (Blank rows = not tested)  6th visit RLE 12/10/23  LANDMARK RIGHT   R LEG (A-D) 5451.6 ml  R THIGH (E-G) ml  R FULL LIMB (A-G) ml  Limb Volume differential (LVD)  %  Volume change since initial  DEC 18.47% since 11/04/23  Volume change overall V  (Blank rows = not tested)   9th visit RLE 12/24/23  [facility] RIGHT   R LEG (A-D) 5451.6 ml  R THIGH (E-G) ml  R FULL LIMB (A-G) ml  Limb Volume differential (LVD)  %  Volume change since initial DECREASED 22.4% overall  Volume change since 12/10/23 DECREASED 4.8%  (Blank rows = not tested) GAIT: Distance walked: arrived in transport wc. Able to transfer to Rx bed and back to transport wc using Single point cane (modified independence)  TODAY'S TREATMENT:                                                                                                                                         LLE ARVILLA MLD Pt/ family education re  LE self-care donning/  doffing, wear, care of and precautions for custom compression garments/ devices Multilayer compression wraps  from toes to knee on L  PATIENT EDUCATION: Simple self-MLD handout given. Continued Pt/ CG edu for lymphedema self care home program throughout session. Topics include outcome of comparative limb volumetrics- starting limb volume differentials (LVDs), technology and gradient techniques used for short stretch, multilayer compression wrapping, simple self-MLD, therapeutic lymphatic pumping exercises,  skin/nail care, LE precautions,. compression garment recommendations and specifications, wear and care schedule and compression garment donning / doffing w assistive devices. Discussed progress towards all OT goals since commencing CDT. All questions answered to the Pt's satisfaction. Good return. Person educated: Patient and family Education method: Explanation, Demonstration, and Handouts Education comprehension: verbalized understanding, returned demonstration, verbal cues required, and needs further education  HOME EXERCISE PROGRAM: BLE lymphatic pumping there ex using- 1 set of 10 reps, each exercise in order-  1-2 x daily, bilaterally Simple self MLD 1 x daily Daily skin care and inspection to increase hydration, skin mobility and decrease infection risk- can be done during MLD Compression wraps 23/7 until garment fitting complete (multilayer compression wraps to R LEG using 1 each of 8, 10, and 12 cm wide short stretch compression wraps over single layer of stockinett and Rosidal Soft foam ( 0.4 cm thick) . Finished wraps with stretch net to keep tape in place. )  ASSESSMENT: CLINICAL IMPRESSION: Pt tolerated LLE/LLQ MLD without increased pain. Skin hydration and excursion are good.  LLE response to CDT is somewhat slower and less dramatic compared with the RLE, but then the RLE played is more progressed and entrenched 2/2 orthopedic trauma. We applied LLE wraps as established at end of session adding a cigarette foam pad at fat pad on lateral ankle. Cont as per POC.   (10/29/23 INITIAL EVALUATION:  Levada Hannay is a 68 yo female presenting with mild, stage 2, BLE lymphedema, R>L.  Longstanding, mild, B ankle swelling is exacerbated for the past 1.5 years s/P an episode of shingles affecting the R leg, and a subsequent fall resulting in ankle fracture with Achilles reconstruction and ongoing, non-healing wound at the distal end of surgical scar. This situation is concerning re elevated  infection risk from lymphatic dysfunction n the context of DM type II,  and ongoing functional decline due to associated problems with ambulation and functional mobility.  Lymphedema occurs secondary to orthopedic trauma when the injury damages lymphatic vessels and structures in the area, leading to a buildup of fluid carrying cellular debris, waste products, bacteria, viruses, and damaged or abnormal cells in the tissues causing protein-rich swelling in the affected body part. This secondary lymphedema, often arising from significant soft tissue trauma accompanying the fracture, can potentially delay wound healing and complicate fracture management.   Progressing BLE lymphedema limits Ms Koroma's functional performance in all occupational domains, including functional ambulation and transfers, standing tolerance, dependent sitting for more than 15 minutes,  basic and instrumental ADLs performance, including lower body dressing, LB bathing, fitting preferred street shoes and LB clothing; driving, shopping, and home management . Lymphedema and associated pain limits Pt's ability to perform productive activities and  leisure pursuits and to participate in socialization at home and in the community.  BLE lymphedema contributes to elevated infection risk and increased falls risk due to body asymmetry. Ms Koop will benefit from skilled OT for Complete Decongestive Therapy (CDT) the gold standard of lymphedema care, including manual lymphatic drainage (MLD), skin care to limit infection risk and increase skin excursion,  lymphatic pumping exercise, and during the Intensive Phase multilayer, gradient compression bandaging to reduce limb volume. Once limb volume reduction reaches a clinical plateau custom compression garments that provide appropriate fit, compression and containment are fitted. Throughout the treatment course Pt/ caregiver will learn lymphedema prevention strategies and precautions, learn to  perform all LE self-care home program components. Without skilled OT for lymphedema care, Pt's condition will progress and further functional decline is expected.)    OBJECTIVE IMPAIRMENTS: Abnormal gait, decreased activity tolerance, decreased balance, decreased knowledge of condition, decreased knowledge of use of DME, decreased mobility, difficulty walking, decreased ROM, decreased strength, increased edema, impaired flexibility, impaired sensation, improper body mechanics, obesity, pain, and chronic , progressive, BLE swelling with related tissue changes and thickening, increased infection risk, non-healing wound .   ACTIVITY LIMITATIONS: Difficulty performing basic and instrumental ADLs (LB dressing, LB bathing,bathing, toileting, dressing, hygiene/grooming, and functional ambulation. Difficulty performing leisure pursuits and productive activities requiring standing and/ or walking for > 19 minutes  PARTICIPATION LIMITATIONS:  Impaired social participation, decreased participation in church  PERSONAL FACTORS: Behavior pattern, Fitness, Past/current experiences, Social background, Time since onset of injury/illness/exacerbation, and 3+ comorbidities: OA, hx R ankle fracture and non-healing wound, hx plantar fasciitis  are also affecting patient's functional outcome.   REHAB POTENTIAL: Good  CLINICAL DECISION MAKING: Evolving/moderate complexity  EVALUATION COMPLEXITY: Moderate   GOALS: Goals reviewed with patient? Yes  SHORT TERM GOALS: Target date: 4th OT Rx visit   Pt will demonstrate understanding of lymphedema precautions and prevention strategies with modified independence using a printed reference to identify at least 5 precautions and discussing how s/he may implement them into daily life to reduce risk of progression with extra time. Baseline:Max A Goal status: GOAL MET  2.  Pt will be able to apply multilayer, knee length, gradient, compression wraps to one leg at a time  from toes to below knee with modified independence (extra time) to decrease limb volume, to limit infection risk, and to limit lymphedema progression.  Baseline: Dependent Goal status: GOAL MET  LONG TERM GOALS: Target date: 02/18/24  Given this patient's Intake score of 50 % on the functional outcomes FOTO tool, patient will experience an increase in function of 5 points to improve basic and instrumental ADLs performance, including lymphedema self-care.  Baseline: 50 Goal status: INITIAL. 12/25/23 GOAL DEFERRED. FOTO tool discontinued.  2.  Given this patient's Intake score of 47.06 % on the Lymphedema Life Impact Scale (LLIS), patient will experience a reduction of at least 5 points in her perceived level of functional impairment resulting from lymphedema to improve functional performance and quality of life (QOL). Baseline: 47.06 % Goal status: PROGRESSING  3.  Pt will achieve at least a 10% volume reduction in B legs to return limb to typical size and shape, to limit infection risk and LE progression, to decrease pain, to improve function. Baseline: Dependent Goal status: Achieved w R LEG w/ overall reduction to date of 22.4%.  4.  Pt will obtain appropriate compression garments/devices and achieve modified independence (extra time + assistive devices) with donning/doffing to optimize limb volume reductions and limit LE progression over time. Baseline: Dependent Goal status: PARTIALLY MET . Fitted RLE garments last session.   During Intensive phase CDT , with modified independence, Pt will achieve at least 85% compliance with all lymphedema self-care home program components, including daily skin care, compression wraps and /or garments, simple self MLD and lymphatic pumping therex to habituate LE self care protocol  into  ADLs for optimal LE self-management over time. Baseline: Dependent Goal status:PROGRESSING  PLAN:  OT FREQUENCY: 2x/week  OT DURATION: 12 weeks and PRN  PLANNED  INTERVENTIONS: 97110-Therapeutic exercises, 97530- Therapeutic activity, 97140- Manual therapy, Dry Needling, Manual lymph drainage, and Compression bandaging Complete Decongestive Therapy: Manual lympathic drainage, skin care,    compression wraps,, then fit with appropriate compression garments during Self-management Phase.  Custom-made gradient compression garments and HOS devices are medically necessary because they are uniquely sized and shaped to fit the exact dimensions of the affected extremities, and to provide appropriate medical grade, graduated compression essential for optimally managing chronic, progressive lymphedema. Multiple custom compression garments are needed to ensure proper hygiene to limit infection risk. Custom compression garments should be replaced q 3-6 months When worn consistently for optimal lipo-lymphedema self-management over time. HOS devices, medically necessary to limit fibrosis buildup in tissue, should be replaced q 2 years and PRN when worn out.     PLAN FOR NEXT SESSION:   Pt edu for lymphedema self-ca home program.  Zebedee Dec, MS, OTR/L, CLT-LANA 01/03/24 11:10 AM

## 2024-01-06 ENCOUNTER — Encounter: Payer: Medicare PPO | Admitting: Occupational Therapy

## 2024-01-10 ENCOUNTER — Ambulatory Visit: Payer: Medicare PPO | Admitting: Occupational Therapy

## 2024-01-10 ENCOUNTER — Encounter: Payer: Self-pay | Admitting: Occupational Therapy

## 2024-01-10 DIAGNOSIS — I890 Lymphedema, not elsewhere classified: Secondary | ICD-10-CM

## 2024-01-10 NOTE — Therapy (Signed)
OUTPATIENT OCCUPATIONAL THERAPY TREATMENT NOTE  BILATERAL LOWER EXTREMITY LYMPHEDEMA   Patient Name: Hayley Barr MRN: [id number] DOB:21-May-1956, 68 y.o., female Today's Date: 01/10/2024  END OF SESSION:   OT End of Session - 01/10/24 1007     Visit Number 12    Number of Visits 36    Date for OT Re-Evaluation 01/27/24    OT Start Time 1000    OT Stop Time 1100    OT Time Calculation (min) 60 min    Activity Tolerance Patient tolerated treatment well;No increased pain    Behavior During Therapy [facility] for tasks assessed/performed             History reviewed. No pertinent past medical history. History reviewed. No pertinent surgical history. Patient Active Problem List   Diagnosis Date Noted   Foot pain, right 05/15/2022   Benign essential hypertension 09/02/2020   Diabetes (HCC) 09/02/2020   Dyslipidemia, goal LDL below 100 09/02/2020   Esophageal reflux 09/02/2020   Irritable bowel syndrome without diarrhea 09/02/2020   Primary osteoarthritis of knees, bilateral 09/02/2020   Psoriasis 09/02/2020   Vitamin D deficiency 09/02/2020   Abscess and cellulitis 02/08/2015   Recurrent umbilical hernia 10/23/2014   Hair loss 09/10/2014   Ventral hernia 09/10/2014    PCP: Jacinto Reap, MD  REFERRING PROVIDER: Alvester Morin, MD  REFERRING DIAG: 189.0  THERAPY DIAG:  Lymphedema, not elsewhere classified  Rationale for Evaluation and Treatment: Rehabilitation  ONSET DATE: 1.5 yrs after an episode of shingles  SUBJECTIVE:                                                                                                                                                                                           SUBJECTIVE STATEMENT: Hayley Barr presents to Occupational Therapy for BLE lymphedema. Pt denies LE lymphedema-related pain today. She tells me she was diligent with compression wraps to R leg. Pt presents with wraps in place.    PERTINENT HISTORY:  HTN, OA, DM type 2, pre-existing B leg and foot lymphedema 2/2 suspected venous insufficiency, R>L. R leg Fx with reconstruction of Achilles;  chronic plantar  fasciitis, Negative for DVT in 1923, Hx R foot abscess and cellulitis, IBS, Psoriasis, bilateral neuropathy feet and legs, currently has PT 2 x weekly   PAIN:  Are you having pain? no, L leg and knee leg pain not rated/10 Location: B legs, feet and ankles, R>L Description: heaviness, stiffness, tightness What makes it better? elevation What makes it worse? Standing, walking, dependent sitting  PRECAUTIONS: Fall Risk;  Infection Risk, LYMPHEDEMA precautions, DM type 2 precautions  WEIGHT BEARING RESTRICTIONS: No  FALLS:  Has patient fallen in last 6 months? Yes. 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  COGNITION:  Overall cognitive status: Within functional limits for tasks assessed   OBSERVATIONS / OTHER ASSESSMENTS:  Mild-moderate, BLE lymphedema 2/2 suspected venous insufficiency and obesity ( weight induced lymphedema.  POSTURE: WFL  LE ROM: WFL for  clinical tasks  LE MMT: WFL for clinical task  LYMPHEDEMA ASSESSMENTS:   SURGERY TYPE/DATE: N/A, non cancer related  Hx INFECTIONS: NONE     Hx WOUNDS: currently has a dime sized non healing wound at base of R heel incision from achilles reconstruction   FOTO functional outcome measure:  Initial 09/29/23: 50%  Lymphedema Life Impact Scale (LLIS) Initial 09/29/23: 47.06%  BLE COMPARATIVE LIMB VOLUMETRICS:   11/04/23 Initial  LANDMARK RIGHT   R LEG (A-D) 6686.8 ml  R THIGH (E-G) ml  R FULL LIMB (A-G) ml  Limb Volume differential (LVD)  0.5 %, L>R  Volume change since initial %  Volume change overall V  (Blank rows = not tested)  LANDMARK LEFT   L LEG (A-D) 6743.7 ml  L THIGH (E-G) ml  L FULL LIMB (A-G) ml  Limb Volume differential (LVD)  %  Volume change since initial %  Volume change overall %   (Blank rows = not tested)  6th visit RLE 12/10/23  LANDMARK RIGHT   R LEG (A-D) 5451.6 ml  R THIGH (E-G) ml  R FULL LIMB (A-G) ml  Limb Volume differential (LVD)  %  Volume change since initial  DEC 18.47% since 11/04/23  Volume change overall V  (Blank rows = not tested)   9th visit RLE 12/24/23  [facility] RIGHT   R LEG (A-D) 5451.6 ml  R THIGH (E-G) ml  R FULL LIMB (A-G) ml  Limb Volume differential (LVD)  %  Volume change since initial DECREASED 22.4% overall  Volume change since 12/10/23 DECREASED 4.8%  (Blank rows = not tested) GAIT: Distance walked: arrived in transport wc. Able to transfer to Rx bed and back to transport wc using Single point cane (modified independence)  TODAY'S TREATMENT:                                                                                                                                         LLE Orson Slick MLD Pt/ family education re  LE  self-care Multilayer compression wraps  from toes to knee on L  PATIENT EDUCATION: Simple self-MLD handout given. Continued Pt/ CG edu for lymphedema self care home program throughout session. Topics include outcome of comparative limb volumetrics- starting limb volume differentials (LVDs), technology and gradient techniques used for short stretch, multilayer compression wrapping, simple self-MLD, therapeutic lymphatic pumping exercises, skin/nail care, LE precautions,. compression garment  recommendations and specifications, wear and care schedule and compression garment donning / doffing w assistive devices. Discussed progress towards all OT goals since commencing CDT. All questions answered to the Pt's satisfaction. Good return. Person educated: Patient and family Education method: Explanation, Demonstration, and Handouts Education comprehension: verbalized understanding, returned demonstration, verbal cues required, and needs further education  HOME EXERCISE PROGRAM: BLE lymphatic pumping there ex using- 1 set of 10 reps, each exercise in order-  1-2 x daily, bilaterally Simple self MLD 1 x daily Daily skin care and inspection to increase hydration, skin mobility and decrease infection risk- can be done during MLD Compression wraps 23/7 until garment fitting complete (multilayer compression wraps to R LEG using 1 each of 8, 10, and 12 cm wide short stretch compression wraps over single layer of stockinett and Rosidal Soft foam ( 0.4 cm thick) . Finished wraps with stretch net to keep tape in place. )  ASSESSMENT: CLINICAL IMPRESSION: Pt tolerated LLE/LLQ MLD without increased pain. Skin hydration and excursion are good.  LLE response to CDT is somewhat slower and less dramatic compared with the RLE, but then the RLE played is more progressed and entrenched 2/2 orthopedic trauma. We applied LLE wraps as established at end of session. Cont as per POC.   (10/29/23 INITIAL EVALUATION:  Erianna Gavigan is a 68 yo female presenting with mild, stage 2, BLE lymphedema, R>L.  Longstanding, mild, B ankle swelling is exacerbated for the past 1.5 years s/P an episode of shingles affecting the R leg, and a subsequent fall resulting in ankle fracture with Achilles reconstruction and ongoing, non-healing wound at the distal end of surgical scar. This situation is concerning re elevated infection risk from lymphatic dysfunction n the context of DM type II,  and ongoing functional decline due to  associated problems with ambulation and functional mobility.  Lymphedema occurs secondary to orthopedic trauma when the injury damages lymphatic vessels and structures in the area, leading to a buildup of fluid carrying cellular debris, waste products, bacteria, viruses, and damaged or abnormal cells in the tissues causing protein-rich swelling in the affected body part. This secondary lymphedema, often arising from significant soft tissue trauma accompanying the fracture, can potentially delay wound healing and complicate fracture management.   Progressing BLE lymphedema limits Hayley Stroebel's functional performance in all occupational domains, including functional ambulation and transfers, standing tolerance, dependent sitting for more than 15 minutes,  basic and instrumental ADLs performance, including lower body dressing, LB bathing, fitting preferred street shoes and LB clothing; driving, shopping, and home management . Lymphedema and associated pain limits Pt's ability to perform productive activities and  leisure pursuits and to participate in socialization at home and in the community.  BLE lymphedema contributes to elevated infection risk and increased falls risk due to body asymmetry. Hayley Gaul will benefit from skilled OT for Complete Decongestive Therapy (CDT) the gold standard of lymphedema care, including manual lymphatic drainage (MLD), skin care to limit infection risk and increase skin excursion, lymphatic pumping exercise, and during the Intensive Phase multilayer, gradient compression bandaging to reduce limb volume. Once limb volume reduction reaches  a clinical plateau custom compression garments that provide appropriate fit, compression and containment are fitted. Throughout the treatment course Pt/ caregiver will learn lymphedema prevention strategies and precautions, learn to perform all LE self-care home program components. Without skilled OT for lymphedema care, Pt's condition will  progress and further functional decline is expected.)    OBJECTIVE IMPAIRMENTS: Abnormal gait, decreased activity tolerance, decreased balance, decreased knowledge of condition, decreased knowledge of use of DME, decreased mobility, difficulty walking, decreased ROM, decreased strength, increased edema, impaired flexibility, impaired sensation, improper body mechanics, obesity, pain, and chronic , progressive, BLE swelling with related tissue changes and thickening, increased infection risk, non-healing wound .   ACTIVITY LIMITATIONS: Difficulty performing basic and instrumental ADLs (LB dressing, LB bathing,bathing, toileting, dressing, hygiene/grooming, and functional ambulation. Difficulty performing leisure pursuits and productive activities requiring standing and/ or walking for > 19 minutes  PARTICIPATION LIMITATIONS:  Impaired social participation, decreased participation in church  PERSONAL FACTORS: Behavior pattern, Fitness, Past/current experiences, Social background, Time since onset of injury/illness/exacerbation, and 3+ comorbidities: OA, hx R ankle fracture and non-healing wound, hx plantar fasciitis  are also affecting patient's functional outcome.   REHAB POTENTIAL: Good  CLINICAL DECISION MAKING: Evolving/moderate complexity  EVALUATION COMPLEXITY: Moderate   GOALS: Goals reviewed with patient? Yes  SHORT TERM GOALS: Target date: 4th OT Rx visit   Pt will demonstrate understanding of lymphedema precautions and prevention strategies with modified independence using a printed reference to identify at least 5 precautions and discussing how s/he may implement them into daily life to reduce risk of progression with extra time. Baseline:Max A Goal status: GOAL MET  2.  Pt will be able to apply multilayer, knee length, gradient, compression wraps to one leg at a time from toes to below knee with modified independence (extra time) to decrease limb volume, to limit infection  risk, and to limit lymphedema progression.  Baseline: Dependent Goal status: GOAL MET  LONG TERM GOALS: Target date: 02/18/24  Given this patient's Intake score of 50 % on the functional outcomes FOTO tool, patient will experience an increase in function of 5 points to improve basic and instrumental ADLs performance, including lymphedema self-care.  Baseline: 50 Goal status: INITIAL. 12/25/23 GOAL DEFERRED. FOTO tool discontinued.  2.  Given this patient's Intake score of 47.06 % on the Lymphedema Life Impact Scale (LLIS), patient will experience a reduction of at least 5 points in her perceived level of functional impairment resulting from lymphedema to improve functional performance and quality of life (QOL). Baseline: 47.06 % Goal status: PROGRESSING  3.  Pt will achieve at least a 10% volume reduction in B legs to return limb to typical size and shape, to limit infection risk and LE progression, to decrease pain, to improve function. Baseline: Dependent Goal status: Achieved w R LEG w/ overall reduction to date of 22.4%.  4.  Pt will obtain appropriate compression garments/devices and achieve modified independence (extra time + assistive devices) with donning/doffing to optimize limb volume reductions and limit LE progression over time. Baseline: Dependent Goal status: PARTIALLY MET . Fitted RLE garments last session.   During Intensive phase CDT , with modified independence, Pt will achieve at least 85% compliance with all lymphedema self-care home program components, including daily skin care, compression wraps and /or garments, simple self MLD and lymphatic pumping therex to habituate LE self care protocol  into ADLs for optimal LE self-management over time. Baseline: Dependent Goal status:PROGRESSING  PLAN:  OT FREQUENCY: 2x/week  OT DURATION: 12  weeks and PRN  PLANNED INTERVENTIONS: 97110-Therapeutic exercises, 97530- Therapeutic activity, 97140- Manual therapy, Dry Needling,  Manual lymph drainage, and Compression bandaging Complete Decongestive Therapy: Manual lympathic drainage, skin care,    compression wraps,, then fit with appropriate compression garments during Self-management Phase.  Custom-made gradient compression garments and HOS devices are medically necessary because they are uniquely sized and shaped to fit the exact dimensions of the affected extremities, and to provide appropriate medical grade, graduated compression essential for optimally managing chronic, progressive lymphedema. Multiple custom compression garments are needed to ensure proper hygiene to limit infection risk. Custom compression garments should be replaced q 3-6 months When worn consistently for optimal lipo-lymphedema self-management over time. HOS devices, medically necessary to limit fibrosis buildup in tissue, should be replaced q 2 years and PRN when worn out.     PLAN FOR NEXT SESSION:   Pt edu for lymphedema self-ca home program.  Loel Dubonnet, Hayley, OTR/L, CLT-LANA 01/10/24 12:09 PM

## 2024-01-13 ENCOUNTER — Encounter: Payer: Self-pay | Admitting: Occupational Therapy

## 2024-01-13 ENCOUNTER — Ambulatory Visit: Payer: Medicare PPO | Admitting: Occupational Therapy

## 2024-01-13 DIAGNOSIS — I890 Lymphedema, not elsewhere classified: Secondary | ICD-10-CM | POA: Diagnosis not present

## 2024-01-13 NOTE — Therapy (Signed)
OUTPATIENT OCCUPATIONAL THERAPY TREATMENT NOTE  BILATERAL LOWER EXTREMITY LYMPHEDEMA   Patient Name: Hayley Barr MRN: [id number] DOB:Feb 13, 1956, 68 y.o., female Today's Date: 01/13/2024  END OF SESSION:   OT End of Session - 01/13/24 1116     Visit Number 13    Number of Visits 36    Date for OT Re-Evaluation 01/27/24    OT Start Time 1110    OT Stop Time 1210    OT Time Calculation (min) 60 min    Activity Tolerance Patient tolerated treatment well;No increased pain    Behavior During Therapy [facility] for tasks assessed/performed             History reviewed. No pertinent past medical history. History reviewed. No pertinent surgical history. Patient Active Problem List   Diagnosis Date Noted   Foot pain, right 05/15/2022   Benign essential hypertension 09/02/2020   Diabetes (HCC) 09/02/2020   Dyslipidemia, goal LDL below 100 09/02/2020   Esophageal reflux 09/02/2020   Irritable bowel syndrome without diarrhea 09/02/2020   Primary osteoarthritis of knees, bilateral 09/02/2020   Psoriasis 09/02/2020   Vitamin D deficiency 09/02/2020   Abscess and cellulitis 02/08/2015   Recurrent umbilical hernia 10/23/2014   Hair loss 09/10/2014   Ventral hernia 09/10/2014    PCP: Jacinto Reap, MD  REFERRING PROVIDER: Alvester Morin, MD  REFERRING DIAG: 189.0  THERAPY DIAG:  Lymphedema, not elsewhere classified  Rationale for Evaluation and Treatment: Rehabilitation  ONSET DATE: 1.5 yrs after an episode of shingles  SUBJECTIVE:                                                                                                                                                                                           SUBJECTIVE STATEMENT: Hayley Barr presents to Occupational Therapy for BLE lymphedema. Pt arrives seated in transport wc. Pt denies LE lymphedema-related pain today. She tells me she was diligent with compression wraps to R leg between  visits.  PERTINENT HISTORY: HTN, OA, DM type 2, pre-existing B leg and foot lymphedema 2/2 suspected venous insufficiency, R>L. R leg Fx with reconstruction of Achilles;  chronic plantar  fasciitis, Negative for DVT in 1923, Hx R foot abscess and cellulitis, IBS, Psoriasis, bilateral neuropathy feet and legs, currently has PT 2 x weekly   PAIN:  Are you having pain? no, L leg and knee leg pain not rated/10 Location: B legs, feet and ankles, R>L Description: heaviness, stiffness, tightness What makes it better? elevation What makes it worse? Standing, walking, dependent sitting  PRECAUTIONS: Fall Risk;  Infection Risk, LYMPHEDEMA precautions, DM type 2 precautions  WEIGHT BEARING RESTRICTIONS: No  FALLS:  Has patient fallen in last 6 months? Yes. 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  COGNITION:  Overall cognitive status: Within functional limits for tasks assessed   OBSERVATIONS / OTHER ASSESSMENTS:  Mild-moderate, BLE lymphedema 2/2 suspected venous insufficiency and obesity ( weight induced lymphedema.  POSTURE: WFL  LE ROM: WFL for  clinical tasks  LE MMT: WFL for clinical task  LYMPHEDEMA ASSESSMENTS:   SURGERY TYPE/DATE: N/A, non cancer related  Hx INFECTIONS: NONE     Hx WOUNDS: currently has a dime sized non healing wound at base of R heel incision from achilles reconstruction   FOTO  functional outcome measure: Initial 09/29/23: 50%  Lymphedema Life Impact Scale (LLIS) Initial 09/29/23: 47.06%  BLE COMPARATIVE LIMB VOLUMETRICS:   11/04/23 Initial  LANDMARK RIGHT   R LEG (A-D) 6686.8 ml  R THIGH (E-G) ml  R FULL LIMB (A-G) ml  Limb Volume differential (LVD)  0.5 %, L>R  Volume change since initial %  Volume change overall V  (Blank rows = not tested)  LANDMARK LEFT   L LEG (A-D) 6743.7 ml  L THIGH (E-G) ml  L FULL LIMB (A-G) ml  Limb Volume differential (LVD)  %  Volume change since initial %  Volume change overall %   (Blank rows = not tested)  6th visit RLE 12/10/23  LANDMARK RIGHT   R LEG (A-D) 5451.6 ml  R THIGH (E-G) ml  R FULL LIMB (A-G) ml  Limb Volume differential (LVD)  %  Volume change since initial  DEC 18.47% since 11/04/23  Volume change overall V  (Blank rows = not tested)   9th visit RLE 12/24/23  [facility] RIGHT   R LEG (A-D) 5451.6 ml  R THIGH (E-G) ml  R FULL LIMB (A-G) ml  Limb Volume differential (LVD)  %  Volume change since initial DECREASED 22.4% overall  Volume change since 12/10/23 DECREASED 4.8%  (Blank rows = not tested) GAIT: Distance walked: arrived in transport wc. Able to transfer to Rx bed and back to transport wc using Single point cane (modified independence)  TODAY'S TREATMENT:                                                                                                                                         Anatomical measurements for R custom toe cap Pt/ family  education re  LE self-care Multilayer compression wraps  from toes to knee on L  PATIENT EDUCATION: Simple self-MLD handout given. Continued Pt/ CG edu for lymphedema self care home program throughout session. Topics include outcome of comparative limb volumetrics- starting limb volume differentials (LVDs), technology and gradient techniques used for short stretch, multilayer compression wrapping, simple self-MLD, therapeutic lymphatic pumping  exercises, skin/nail care, LE precautions,. compression garment recommendations and specifications, wear and care schedule and compression garment donning / doffing w assistive devices. Discussed progress towards all OT goals since commencing CDT. All questions answered to the Pt's satisfaction. Good return. Person educated: Patient and family Education method: Explanation, Demonstration, and Handouts Education comprehension: verbalized understanding, returned demonstration, verbal cues required, and needs further education  HOME EXERCISE PROGRAM: BLE lymphatic pumping there ex using- 1 set of 10 reps, each exercise in order-  1-2 x daily, bilaterally Simple self MLD 1 x daily Daily skin care and inspection to increase hydration, skin mobility and decrease infection risk- can be done during MLD Compression wraps 23/7 until garment fitting complete (multilayer compression wraps to R LEG using 1 each of 8, 10, and 12 cm wide short stretch compression wraps over single layer of stockinett and Rosidal Soft foam ( 0.4 cm thick) . Finished wraps with stretch net to keep tape in place. )  ASSESSMENT: CLINICAL IMPRESSION: Pt experiencing some swelling at dorsal R foot even when wearing new, ccl 2, custom,  Elvarex stocking. Because stocking is compressive fluid is being pushed distally into the toes and forefoot. We completed measurements for a custom toe cap today AND submitted TO DME vendor after session. We applied LLE wraps as established at end of session, which patients tolerates without difficulty.  Cont as per POC.  Custom-made gradient compression garments and HOS devices are medically necessary because they are uniquely sized and shaped to fit the exact dimensions of the affected extremities, and to provide appropriate medical grade, graduated compression essential for optimally managing chronic, progressive lymphedema. Multiple custom compression garments are needed to ensure proper hygiene to limit  infection risk. Custom compression garments should be replaced q 3-6 months When worn consistently for optimal lipo-lymphedema self-management over time. HOS devices, medically necessary to limit fibrosis buildup in tissue, should be replaced q 2 years and PRN when worn out.      (10/29/23 INITIAL EVALUATION:  Albirtha Mermelstein is a 68 yo female presenting with mild, stage 2, BLE lymphedema, R>L.  Longstanding, mild, B ankle swelling is exacerbated for the past 1.5 years s/P an episode of shingles affecting the R leg, and a subsequent fall resulting in ankle fracture with Achilles reconstruction and ongoing, non-healing wound at the distal end of surgical scar. This situation is concerning re elevated infection risk from lymphatic dysfunction in the context of DM type II,  and ongoing functional decline due to associated problems with ambulation and functional mobility.  Lymphedema occurs secondary to orthopedic trauma when the injury damages lymphatic vessels and structures in the area, leading to a buildup of fluid carrying cellular debris, waste products, bacteria, viruses, and damaged or abnormal cells in the tissues causing protein-rich swelling in the affected body part. This secondary lymphedema, often arising from significant soft tissue trauma accompanying the fracture, can potentially delay wound healing and complicate fracture management.   Progressing BLE lymphedema limits Hayley Neiswender's functional performance in all occupational domains, including functional ambulation and transfers, standing tolerance, dependent sitting for more than 15 minutes,  basic and instrumental ADLs performance,  including lower body dressing, LB bathing, fitting preferred street shoes and LB clothing; driving, shopping, and home management . Lymphedema and associated pain limits Pt's ability to perform productive activities and  leisure pursuits and to participate in socialization at home and in the community.  BLE  lymphedema contributes to elevated infection risk and increased falls risk due to body asymmetry. Hayley Vigo will benefit from skilled OT for Complete Decongestive Therapy (CDT) the gold standard of lymphedema care, including manual lymphatic drainage (MLD), skin care to limit infection risk and increase skin excursion, lymphatic pumping exercise, and during the Intensive Phase multilayer, gradient compression bandaging to reduce limb volume. Once limb volume reduction reaches a clinical plateau custom compression garments that provide appropriate fit, compression and containment are fitted. Throughout the treatment course Pt/ caregiver will learn lymphedema prevention strategies and precautions, learn to perform all LE self-care home program components. Without skilled OT for lymphedema care, Pt's condition will progress and further functional decline is expected.)    OBJECTIVE IMPAIRMENTS: Abnormal gait, decreased activity tolerance, decreased balance, decreased knowledge of condition, decreased knowledge of use of DME, decreased mobility, difficulty walking, decreased ROM, decreased strength, increased edema, impaired flexibility, impaired sensation, improper body mechanics, obesity, pain, and chronic , progressive, BLE swelling with related tissue changes and thickening, increased infection risk, non-healing wound .   ACTIVITY LIMITATIONS: Difficulty performing basic and instrumental ADLs (LB dressing, LB bathing,bathing, toileting, dressing, hygiene/grooming, and functional ambulation. Difficulty performing leisure pursuits and productive activities requiring standing and/ or walking for > 19 minutes  PARTICIPATION LIMITATIONS:  Impaired social participation, decreased participation in church  PERSONAL FACTORS: Behavior pattern, Fitness, Past/current experiences, Social background, Time since onset of injury/illness/exacerbation, and 3+ comorbidities: OA, hx R ankle fracture and non-healing wound,  hx plantar fasciitis  are also affecting patient's functional outcome.   REHAB POTENTIAL: Good  CLINICAL DECISION MAKING: Evolving/moderate complexity  EVALUATION COMPLEXITY: Moderate   GOALS: Goals reviewed with patient? Yes  SHORT TERM GOALS: Target date: 4th OT Rx visit   Pt will demonstrate understanding of lymphedema precautions and prevention strategies with modified independence using a printed reference to identify at least 5 precautions and discussing how s/he may implement them into daily life to reduce risk of progression with extra time. Baseline:Max A Goal status: GOAL MET  2.  Pt will be able to apply multilayer, knee length, gradient, compression wraps to one leg at a time from toes to below knee with modified independence (extra time) to decrease limb volume, to limit infection risk, and to limit lymphedema progression.  Baseline: Dependent Goal status: GOAL MET  LONG TERM GOALS: Target date: 02/18/24  Given this patient's Intake score of 50 % on the functional outcomes FOTO tool, patient will experience an increase in function of 5 points to improve basic and instrumental ADLs performance, including lymphedema self-care.  Baseline: 50 Goal status: INITIAL. 12/25/23 GOAL DEFERRED. FOTO tool discontinued.  2.  Given this patient's Intake score of 47.06 % on the Lymphedema Life Impact Scale (LLIS), patient will experience a reduction of at least 5 points in her perceived level of functional impairment resulting from lymphedema to improve functional performance and quality of life (QOL). Baseline: 47.06 % Goal status: PROGRESSING  3.  Pt will achieve at least a 10% volume reduction in B legs to return limb to typical size and shape, to limit infection risk and LE progression, to decrease pain, to improve function. Baseline: Dependent Goal status: Achieved w R LEG w/ overall reduction to  date of 22.4%.  4.  Pt will obtain appropriate compression garments/devices and  achieve modified independence (extra time + assistive devices) with donning/doffing to optimize limb volume reductions and limit LE progression over time. Baseline: Dependent Goal status: PARTIALLY MET . Fitted RLE garments last session.   During Intensive phase CDT , with modified independence, Pt will achieve at least 85% compliance with all lymphedema self-care home program components, including daily skin care, compression wraps and /or garments, simple self MLD and lymphatic pumping therex to habituate LE self care protocol  into ADLs for optimal LE self-management over time. Baseline: Dependent Goal status:PROGRESSING  PLAN:  OT FREQUENCY: 2x/week  OT DURATION: 12 weeks and PRN  PLANNED INTERVENTIONS: 97110-Therapeutic exercises, 97530- Therapeutic activity, 97140- Manual therapy, Dry Needling, Manual lymph drainage, and Compression bandaging Complete Decongestive Therapy: Manual lympathic drainage, skin care,    compression wraps,, then fit with appropriate compression garments during Self-management Phase.  PLAN FOR NEXT SESSION:   Pt edu for lymphedema self-ca home program.  Loel Dubonnet, Hayley, OTR/L, [facility] 01/13/24 3:18 PM

## 2024-01-15 ENCOUNTER — Ambulatory Visit: Payer: Medicare PPO | Admitting: Occupational Therapy

## 2024-01-20 ENCOUNTER — Ambulatory Visit: Payer: Medicare PPO | Admitting: Occupational Therapy

## 2024-01-20 DIAGNOSIS — I890 Lymphedema, not elsewhere classified: Secondary | ICD-10-CM

## 2024-01-20 NOTE — Therapy (Addendum)
 OUTPATIENT OCCUPATIONAL THERAPY TREATMENT NOTE  BILATERAL LOWER EXTREMITY LYMPHEDEMA   Patient Name: Hayley Barr MRN: [id number] DOB:[birth year], 68 y.o., female Today's Date: 01/20/2024  END OF SESSION:   OT End of Session - 01/20/24 1307     Visit Number 14    Number of Visits 36    Date for OT Re-Evaluation 01/27/24    OT Start Time 0102    OT Stop Time 0205    OT Time Calculation (min) 63 min    Activity Tolerance Patient tolerated treatment well;No increased pain    Behavior During Therapy [facility] for tasks assessed/performed             No past medical history on file. No past surgical history on file. Patient Active Problem List   Diagnosis Date Noted   Foot pain, right 05/15/2022   Benign essential hypertension 09/02/2020   Diabetes (HCC) 09/02/2020   Dyslipidemia, goal LDL below 100 09/02/2020   Esophageal reflux 09/02/2020   Irritable bowel syndrome without diarrhea 09/02/2020   Primary osteoarthritis of knees, bilateral 09/02/2020   Psoriasis 09/02/2020   Vitamin D deficiency 09/02/2020   Abscess and cellulitis 02/08/2015   Recurrent umbilical hernia 10/23/2014   Hair loss 09/10/2014   Ventral hernia 09/10/2014    PCP: Jacinto Reap, MD  REFERRING PROVIDER: Alvester Morin, MD  REFERRING DIAG: 189.0  THERAPY DIAG:  Lymphedema, not elsewhere classified  Rationale for Evaluation and Treatment: Rehabilitation  ONSET DATE: 1.5 yrs after an episode of shingles  SUBJECTIVE:                                                                                                                                                                                           SUBJECTIVE STATEMENT: Hayley Barr presents to Occupational Therapy for BLE lymphedema. Pt arrives seated in transport wc. Pt denies LE lymphedema-related pain today. She tells me she was diligent with compression wraps to R leg between visits.  PERTINENT HISTORY: HTN, OA, DM type 2,  pre-existing B leg and foot lymphedema 2/2 suspected venous insufficiency, R>L. R leg Fx with reconstruction of Achilles;  chronic plantar  fasciitis, Negative for DVT in 1923, Hx R foot abscess and cellulitis, IBS, Psoriasis, bilateral neuropathy feet and legs, currently has PT 2 x weekly   PAIN:  Are you having pain? no, L leg and knee leg pain not rated/10 Location: B legs, feet and ankles, R>L Description: heaviness, stiffness, tightness What makes it better? elevation What makes it worse? Standing, walking, dependent sitting  PRECAUTIONS: Fall Risk;  Infection Risk, LYMPHEDEMA precautions, DM type 2 precautions  WEIGHT BEARING RESTRICTIONS: No  FALLS:  Has patient fallen in last 6 months? Yes. 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  COGNITION:  Overall cognitive status: Within functional limits for tasks assessed   OBSERVATIONS / OTHER ASSESSMENTS:  Mild-moderate, BLE lymphedema 2/2 suspected venous insufficiency and obesity ( weight induced lymphedema.  POSTURE: WFL  LE ROM: WFL for  clinical tasks  LE MMT: WFL for clinical task  LYMPHEDEMA ASSESSMENTS:   SURGERY TYPE/DATE: N/A, non cancer related  Hx INFECTIONS: NONE     Hx WOUNDS: currently has a dime sized non healing wound at base of R heel incision from achilles reconstruction   FOTO functional outcome measure: Initial 09/29/23:  50%  Lymphedema Life Impact Scale (LLIS) Initial 09/29/23: 47.06%  BLE COMPARATIVE LIMB VOLUMETRICS:   11/04/23 Initial  LANDMARK RIGHT   R LEG (A-D) 6686.8 ml  R THIGH (E-G) ml  R FULL LIMB (A-G) ml  Limb Volume differential (LVD)  0.5 %, L>R  Volume change since initial %  Volume change overall V  (Blank rows = not tested)  LANDMARK LEFT   L LEG (A-D) 6743.7 ml  L THIGH (E-G) ml  L FULL LIMB (A-G) ml  Limb Volume differential (LVD)  %  Volume change since initial %  Volume change overall %   (Blank rows = not tested)  6th visit RLE 12/10/23  LANDMARK RIGHT   R LEG (A-D) 5451.6 ml  R THIGH (E-G) ml  R FULL LIMB (A-G) ml  Limb Volume differential (LVD)  %  Volume change since initial  DEC 18.47% since 11/04/23  Volume change overall V  (Blank rows = not tested)   9th visit RLE 12/24/23  [facility] RIGHT   R LEG (A-D) 5451.6 ml  R THIGH (E-G) ml  R FULL LIMB (A-G) ml  Limb Volume differential (LVD)  %  Volume change since initial DECREASED 22.4% overall  Volume change since 12/10/23 DECREASED 4.8%  (Blank rows = not tested) GAIT: Distance walked: arrived in transport wc. Able to transfer to Rx bed and back to transport wc using Single point cane (modified independence)  TODAY'S TREATMENT:                                                                                                                                         MLD to LLE/LLQ Pt/ family education re  LE self-care Multilayer  compression wraps  from toes to knee on L  PATIENT EDUCATION: Simple self-MLD handout given. Continued Pt/ CG edu for lymphedema self care home program throughout session. Topics include outcome of comparative limb volumetrics- starting limb volume differentials (LVDs), technology and gradient techniques used for short stretch, multilayer compression wrapping, simple self-MLD, therapeutic lymphatic pumping exercises, skin/nail care, LE precautions,. compression garment recommendations  and specifications, wear and care schedule and compression garment donning / doffing w assistive devices. Discussed progress towards all OT goals since commencing CDT. All questions answered to the Pt's satisfaction. Good return. Person educated: Patient and family Education method: Explanation, Demonstration, and Handouts Education comprehension: verbalized understanding, returned demonstration, verbal cues required, and needs further education  HOME EXERCISE PROGRAM: BLE lymphatic pumping there ex using- 1 set of 10 reps, each exercise in order-  1-2 x daily, bilaterally Simple self MLD 1 x daily Daily skin care and inspection to increase hydration, skin mobility and decrease infection risk- can be done during MLD Compression wraps 23/7 until garment fitting complete (multilayer compression wraps to R LEG using 1 each of 8, 10, and 12 cm wide short stretch compression wraps over single layer of stockinett and Rosidal Soft foam ( 0.4 cm thick) . Finished wraps with stretch net to keep tape in place. )  ASSESSMENT: CLINICAL IMPRESSION: Pt continues to experience increased swelling at dorsal R foot since fitting with  ccl 2, custom,  Elvarex, knee length, compression stocking. Custom R toe cap is needed to counteract displacement of lymphatic congestion  distally into toes and dorsal foot by the custom, ccl 2,  knee length compression stocking.  Pt tolerated LLE MLD and fibrosis techniques to lateral ankle without increased pain. LLE wraps were applied to the LLE as established at end of session, which patients tolerates without difficulty.  Cont as per POC.  Custom-made gradient compression garments and HOS devices are medically necessary because they are uniquely sized and shaped to fit the exact dimensions of the affected extremities, and to provide appropriate medical grade, graduated compression essential for optimally managing chronic, progressive lymphedema. Multiple custom compression garments  are needed to ensure proper hygiene to limit infection risk. Custom compression garments should be replaced q 3-6 months When worn consistently for optimal lipo-lymphedema self-management over time. HOS devices, medically necessary to limit fibrosis buildup in tissue, should be replaced q 2 years and PRN when worn out.     Pt should be fit:  2  Left and 2 Right, custom, Jobst, flat knit, Elvarex Classic, ccl 2 or 3, knee length compression stockings with open toes to limit and control chronic leg swelling, to improve lymphatic circulation, to reduce infection risk, to limit formation of tissue fibrosis and to limit progression of lymphedema over time. Custom compression garments should be replaced q 3-6 months when worn consistently for optimal lymphedema self-management.   Pt should be fit with 1 R and 1 left, custom, flat knit, Elvarex PLUS (seamless) , ccl 2 toe cap to   Pt should be fit with 1 R and 1 L custom, Jobst Relax convoluted Hours-of-Sleep (HOS)  compression device to counteract fluid accumulation and fibrosis formation leading to lymphedema progression. at night. HOS devices, medically necessary to limit fibrosis buildup in tissue, should be replaced q 2 years and PRN when worn out.    (10/29/23 INITIAL EVALUATION:  Hayley Barr is a 68 yo female presenting with mild, stage 2, BLE lymphedema, R>L.  Longstanding, mild, B ankle swelling is exacerbated for the  past 1.5 years s/P an episode of shingles affecting the R leg, and a subsequent fall resulting in ankle fracture with Achilles reconstruction and ongoing, non-healing wound at the distal end of surgical scar. This situation is concerning re elevated infection risk from lymphatic dysfunction in the context of DM type II,  and ongoing functional decline due to associated problems with ambulation and functional mobility.  Lymphedema occurs secondary to orthopedic trauma when the injury damages lymphatic vessels and structures in the  area, leading to a buildup of fluid carrying cellular debris, waste products, bacteria, viruses, and damaged or abnormal cells in the tissues causing protein-rich swelling in the affected body part. This secondary lymphedema, often arising from significant soft tissue trauma accompanying the fracture, can potentially delay wound healing and complicate fracture management.   Progressing BLE lymphedema limits Hayley Stoy's functional performance in all occupational domains, including functional ambulation and transfers, standing tolerance, dependent sitting for more than 15 minutes,  basic and instrumental ADLs performance, including lower body dressing, LB bathing, fitting preferred street shoes and LB clothing; driving, shopping, and home management . Lymphedema and associated pain limits Pt's ability to perform productive activities and  leisure pursuits and to participate in socialization at home and in the community.  BLE lymphedema contributes to elevated infection risk and increased falls risk due to body asymmetry. Hayley Shaver will benefit from skilled OT for Complete Decongestive Therapy (CDT) the gold standard of lymphedema care, including manual lymphatic drainage (MLD), skin care to limit infection risk and increase skin excursion, lymphatic pumping exercise, and during the Intensive Phase multilayer, gradient compression bandaging to reduce limb volume. Once limb volume reduction reaches a clinical plateau custom compression garments that provide appropriate fit, compression and containment are fitted. Throughout the treatment course Pt/ caregiver will learn lymphedema prevention strategies and precautions, learn to perform all LE self-care home program components. Without skilled OT for lymphedema care, Pt's condition will progress and further functional decline is expected.)    OBJECTIVE IMPAIRMENTS: Abnormal gait, decreased activity tolerance, decreased balance, decreased knowledge of  condition, decreased knowledge of use of DME, decreased mobility, difficulty walking, decreased ROM, decreased strength, increased edema, impaired flexibility, impaired sensation, improper body mechanics, obesity, pain, and chronic , progressive, BLE swelling with related tissue changes and thickening, increased infection risk, non-healing wound .   ACTIVITY LIMITATIONS: Difficulty performing basic and instrumental ADLs (LB dressing, LB bathing,bathing, toileting, dressing, hygiene/grooming, and functional ambulation. Difficulty performing leisure pursuits and productive activities requiring standing and/ or walking for > 19 minutes  PARTICIPATION LIMITATIONS:  Impaired social participation, decreased participation in church  PERSONAL FACTORS: Behavior pattern, Fitness, Past/current experiences, Social background, Time since onset of injury/illness/exacerbation, and 3+ comorbidities: OA, hx R ankle fracture and non-healing wound, hx plantar fasciitis  are also affecting patient's functional outcome.   REHAB POTENTIAL: Good  CLINICAL DECISION MAKING: Evolving/moderate complexity  EVALUATION COMPLEXITY: Moderate   GOALS: Goals reviewed with patient? Yes  SHORT TERM GOALS: Target date: 4th OT Rx visit   Pt will demonstrate understanding of lymphedema precautions and prevention strategies with modified independence using a printed reference to identify at least 5 precautions and discussing how s/he may implement them into daily life to reduce risk of progression with extra time. Baseline:Max A Goal status: GOAL MET  2.  Pt will be able to apply multilayer, knee length, gradient, compression wraps to one leg at a time from toes to below knee with modified independence (extra time) to decrease limb volume, to limit  infection risk, and to limit lymphedema progression.  Baseline: Dependent Goal status: GOAL MET  LONG TERM GOALS: Target date: 02/18/24  Given this patient's Intake score of 50 %  on the functional outcomes FOTO tool, patient will experience an increase in function of 5 points to improve basic and instrumental ADLs performance, including lymphedema self-care.  Baseline: 50 Goal status: INITIAL. 12/25/23 GOAL DEFERRED. FOTO tool discontinued.  2.  Given this patient's Intake score of 47.06 % on the Lymphedema Life Impact Scale (LLIS), patient will experience a reduction of at least 5 points in her perceived level of functional impairment resulting from lymphedema to improve functional performance and quality of life (QOL). Baseline: 47.06 % Goal status: PROGRESSING  3.  Pt will achieve at least a 10% volume reduction in B legs to return limb to typical size and shape, to limit infection risk and LE progression, to decrease pain, to improve function. Baseline: Dependent Goal status: Achieved w R LEG w/ overall reduction to date of 22.4%.  4.  Pt will obtain appropriate compression garments/devices and achieve modified independence (extra time + assistive devices) with donning/doffing to optimize limb volume reductions and limit LE progression over time. Baseline: Dependent Goal status: PARTIALLY MET . Fitted RLE garments last session.   During Intensive phase CDT , with modified independence, Pt will achieve at least 85% compliance with all lymphedema self-care home program components, including daily skin care, compression wraps and /or garments, simple self MLD and lymphatic pumping therex to habituate LE self care protocol  into ADLs for optimal LE self-management over time. Baseline: Dependent Goal status:PROGRESSING  PLAN:  OT FREQUENCY: 2x/week  OT DURATION: 12 weeks and PRN  PLANNED INTERVENTIONS: 97110-Therapeutic exercises, 97530- Therapeutic activity, 97140- Manual therapy, Dry Needling, Manual lymph drainage, and Compression bandaging Complete Decongestive Therapy: Manual lympathic drainage, skin care,    compression wraps,, then fit with appropriate  compression garments during Self-management Phase.  PLAN FOR NEXT SESSION:   Pt edu for lymphedema self-ca home program.  Loel Dubonnet, Hayley, OTR/L, [facility] 01/20/24 3:49 PM

## 2024-01-24 ENCOUNTER — Ambulatory Visit: Payer: Medicare PPO | Admitting: Occupational Therapy

## 2024-01-27 ENCOUNTER — Ambulatory Visit: Payer: Medicare PPO | Attending: Cardiothoracic Surgery | Admitting: Occupational Therapy

## 2024-01-27 DIAGNOSIS — I890 Lymphedema, not elsewhere classified: Secondary | ICD-10-CM | POA: Diagnosis present

## 2024-01-27 NOTE — Therapy (Signed)
 OUTPATIENT OCCUPATIONAL THERAPY TREATMENT NOTE  BILATERAL LOWER EXTREMITY LYMPHEDEMA   Patient Name: Hayley Barr MRN: [id number] DOB:Oct 25, 1956, 68 y.o., female Today's Date: 01/27/2024  END OF SESSION:   OT End of Session - 01/27/24 1259     Visit Number 15    Number of Visits 36    Date for OT Re-Evaluation 01/27/24    OT Start Time 0101    OT Stop Time 0203    OT Time Calculation (min) 62 min    Activity Tolerance Patient tolerated treatment well;No increased pain    Behavior During Therapy [facility] for tasks assessed/performed             No past medical history on file. No past surgical history on file. Patient Active Problem List   Diagnosis Date Noted   Foot pain, right 05/15/2022   Benign essential hypertension 09/02/2020   Diabetes (HCC) 09/02/2020   Dyslipidemia, goal LDL below 100 09/02/2020   Esophageal reflux 09/02/2020   Irritable bowel syndrome without diarrhea 09/02/2020   Primary osteoarthritis of knees, bilateral 09/02/2020   Psoriasis 09/02/2020   Vitamin D deficiency 09/02/2020   Abscess and cellulitis 02/08/2015   Recurrent umbilical hernia 10/23/2014   Hair loss 09/10/2014   Ventral hernia 09/10/2014    PCP: Jacinto Reap, MD  REFERRING PROVIDER: Alvester Morin, MD  REFERRING DIAG: 189.0  THERAPY DIAG:  Lymphedema, not elsewhere classified  Rationale for Evaluation and Treatment: Rehabilitation  ONSET DATE: 1.5 yrs after an episode of shingles  SUBJECTIVE:                                                                                                                                                                                           SUBJECTIVE STATEMENT: Ms Reser presents to Occupational Therapy for BLE lymphedema. Pt arrives seated in transport wc. Pt denies LE lymphedema-related pain today. She tells me she was diligent with compression wraps to R leg between visits.  PERTINENT HISTORY: HTN, OA, DM type 2,  pre-existing B leg and foot lymphedema 2/2 suspected venous insufficiency, R>L. R leg Fx with reconstruction of Achilles;  chronic plantar  fasciitis, Negative for DVT in 1923, Hx R foot abscess and cellulitis, IBS, Psoriasis, bilateral neuropathy feet and legs, currently has PT 2 x weekly   PAIN:  Are you having pain? no, L leg and knee leg pain not rated/10 Location: B legs, feet and ankles, R>L Description: heaviness, stiffness, tightness What makes it better? elevation What makes it worse? Standing, walking, dependent sitting  PRECAUTIONS: Fall Risk;  Infection Risk, LYMPHEDEMA precautions, DM type 2 precautions  WEIGHT BEARING RESTRICTIONS: No  FALLS:  Has patient fallen in last 6 months? Yes. 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  COGNITION:  Overall cognitive status: Within functional limits for tasks assessed   OBSERVATIONS / OTHER ASSESSMENTS:  Mild-moderate, BLE lymphedema 2/2 suspected venous insufficiency and obesity ( weight induced lymphedema.  POSTURE: WFL  LE ROM: WFL for  clinical tasks  LE MMT: WFL for clinical task  LYMPHEDEMA ASSESSMENTS:   SURGERY TYPE/DATE: N/A, non cancer related  Hx INFECTIONS: NONE     Hx WOUNDS: currently has a dime sized non healing wound at base of R heel incision from achilles reconstruction   FOTO functional outcome measure: Initial 09/29/23:  50%  Lymphedema Life Impact Scale (LLIS) Initial 09/29/23: 47.06%  BLE COMPARATIVE LIMB VOLUMETRICS:   11/04/23 Initial  LANDMARK RIGHT   R LEG (A-D) 6686.8 ml  R THIGH (E-G) ml  R FULL LIMB (A-G) ml  Limb Volume differential (LVD)  0.5 %, L>R  Volume change since initial %  Volume change overall V  (Blank rows = not tested)  LANDMARK LEFT   L LEG (A-D) 6743.7 ml  L THIGH (E-G) ml  L FULL LIMB (A-G) ml  Limb Volume differential (LVD)  %  Volume change since initial %  Volume change overall %   (Blank rows = not tested)  6th visit RLE 12/10/23  LANDMARK RIGHT   R LEG (A-D) 5451.6 ml  R THIGH (E-G) ml  R FULL LIMB (A-G) ml  Limb Volume differential (LVD)  %  Volume change since initial  DEC 18.47% since 11/04/23  Volume change overall V  (Blank rows = not tested)   9th visit RLE 12/24/23  [facility] RIGHT   R LEG (A-D) 5451.6 ml  R THIGH (E-G) ml  R FULL LIMB (A-G) ml  Limb Volume differential (LVD)  %  Volume change since initial DECREASED 22.4% overall  Volume change since 12/10/23 DECREASED 4.8%  (Blank rows = not tested) GAIT: Distance walked: arrived in transport wc. Able to transfer to Rx bed and back to transport wc using Single point cane (modified independence)  TODAY'S TREATMENT:                                                                                                                                         MLD to LLE/LLQ Pt/ family education re  LE self-care Multilayer  compression wraps  from toes to knee on L  PATIENT EDUCATION: Simple self-MLD handout given. Continued Pt/ CG edu for lymphedema self care home program throughout session. Topics include outcome of comparative limb volumetrics- starting limb volume differentials (LVDs), technology and gradient techniques used for short stretch, multilayer compression wrapping, simple self-MLD, therapeutic lymphatic pumping exercises, skin/nail care, LE precautions,. compression garment recommendations  and specifications, wear and care schedule and compression garment donning / doffing w assistive devices. Discussed progress towards all OT goals since commencing CDT. All questions answered to the Pt's satisfaction. Good return. Person educated: Patient and family Education method: Explanation, Demonstration, and Handouts Education comprehension: verbalized understanding, returned demonstration, verbal cues required, and needs further education  HOME EXERCISE PROGRAM: BLE lymphatic pumping there ex using- 1 set of 10 reps, each exercise in order-  1-2 x daily, bilaterally Simple self MLD 1 x daily Daily skin care and inspection to increase hydration, skin mobility and decrease infection risk- can be done during MLD Compression wraps 23/7 until garment fitting complete (multilayer compression wraps to R LEG using 1 each of 8, 10, and 12 cm wide short stretch compression wraps over single layer of stockinett and Rosidal Soft foam ( 0.4 cm thick) . Finished wraps with stretch net to keep tape in place. )  ASSESSMENT: CLINICAL IMPRESSION: Pt tolerated MLD and compression wrapping today without increased pain. L Leg     volume continues to decrease slowly and skin condition is  improved in terms of hydration and excursion. Cont as per POC.    (10/29/23 INITIAL EVALUATION:  Jodine Rehm is a 68 yo female presenting with mild, stage 2, BLE lymphedema, R>L.  Longstanding, mild, B ankle swelling is exacerbated for the past 1.5 years s/P an episode of shingles affecting the R leg, and a subsequent fall resulting in ankle fracture with Achilles reconstruction and ongoing, non-healing wound at the distal end of surgical scar. This situation is concerning re elevated infection risk from lymphatic dysfunction in the context of DM type II,  and ongoing functional decline due to associated problems with ambulation and functional mobility.  Lymphedema occurs secondary to orthopedic trauma when the injury damages  lymphatic vessels and structures in the area, leading to a buildup of fluid carrying cellular debris, waste products, bacteria, viruses, and damaged or abnormal cells in the tissues causing protein-rich swelling in the affected body part. This secondary lymphedema, often arising from significant soft tissue trauma accompanying the fracture, can potentially delay wound healing and complicate fracture management.   Progressing BLE lymphedema limits Ms Azua's functional performance in all occupational domains, including functional ambulation and transfers, standing tolerance, dependent sitting for more than 15 minutes,  basic and instrumental ADLs performance, including lower body dressing, LB bathing, fitting preferred street shoes and LB clothing; driving, shopping, and home management . Lymphedema and associated pain limits Pt's ability to perform productive activities and  leisure pursuits and to participate in socialization at home and in the community.  BLE lymphedema contributes to elevated infection risk and increased falls risk due to body asymmetry. Ms Kamau will benefit from skilled OT for Complete Decongestive Therapy (CDT) the gold standard of lymphedema care, including manual lymphatic drainage (MLD), skin care to limit infection risk and increase skin excursion, lymphatic pumping exercise, and during the Intensive Phase multilayer, gradient compression bandaging to reduce limb volume. Once limb volume reduction reaches a clinical plateau custom compression garments that provide appropriate fit, compression and containment are fitted. Throughout the treatment course  Pt/ caregiver will learn lymphedema prevention strategies and precautions, learn to perform all LE self-care home program components. Without skilled OT for lymphedema care, Pt's condition will progress and further functional decline is expected.)    OBJECTIVE IMPAIRMENTS: Abnormal gait, decreased activity tolerance, decreased  balance, decreased knowledge of condition, decreased knowledge of use of DME, decreased mobility, difficulty walking, decreased ROM, decreased strength, increased edema, impaired flexibility, impaired sensation, improper body mechanics, obesity, pain, and chronic , progressive, BLE swelling with related tissue changes and thickening, increased infection risk, non-healing wound .   ACTIVITY LIMITATIONS: Difficulty performing basic and instrumental ADLs (LB dressing, LB bathing,bathing, toileting, dressing, hygiene/grooming, and functional ambulation. Difficulty performing leisure pursuits and productive activities requiring standing and/ or walking for > 19 minutes  PARTICIPATION LIMITATIONS:  Impaired social participation, decreased participation in church  PERSONAL FACTORS: Behavior pattern, Fitness, Past/current experiences, Social background, Time since onset of injury/illness/exacerbation, and 3+ comorbidities: OA, hx R ankle fracture and non-healing wound, hx plantar fasciitis  are also affecting patient's functional outcome.   REHAB POTENTIAL: Good  CLINICAL DECISION MAKING: Evolving/moderate complexity  EVALUATION COMPLEXITY: Moderate   GOALS: Goals reviewed with patient? Yes  SHORT TERM GOALS: Target date: 4th OT Rx visit   Pt will demonstrate understanding of lymphedema precautions and prevention strategies with modified independence using a printed reference to identify at least 5 precautions and discussing how s/he may implement them into daily life to reduce risk of progression with extra time. Baseline:Max A Goal status: GOAL MET  2.  Pt will be able to apply multilayer, knee length, gradient, compression wraps to one leg at a time from toes to below knee with modified independence (extra time) to decrease limb volume, to limit infection risk, and to limit lymphedema progression.  Baseline: Dependent Goal status: GOAL MET  LONG TERM GOALS: Target date: 02/18/24  Given this  patient's Intake score of 50 % on the functional outcomes FOTO tool, patient will experience an increase in function of 5 points to improve basic and instrumental ADLs performance, including lymphedema self-care.  Baseline: 50 Goal status: INITIAL. 12/25/23 GOAL DEFERRED. FOTO tool discontinued.  2.  Given this patient's Intake score of 47.06 % on the Lymphedema Life Impact Scale (LLIS), patient will experience a reduction of at least 5 points in her perceived level of functional impairment resulting from lymphedema to improve functional performance and quality of life (QOL). Baseline: 47.06 % Goal status: PROGRESSING  3.  Pt will achieve at least a 10% volume reduction in B legs to return limb to typical size and shape, to limit infection risk and LE progression, to decrease pain, to improve function. Baseline: Dependent Goal status: Achieved w R LEG w/ overall reduction to date of 22.4%.  4.  Pt will obtain appropriate compression garments/devices and achieve modified independence (extra time + assistive devices) with donning/doffing to optimize limb volume reductions and limit LE progression over time. Baseline: Dependent Goal status: PARTIALLY MET . Fitted RLE garments last session.   During Intensive phase CDT , with modified independence, Pt will achieve at least 85% compliance with all lymphedema self-care home program components, including daily skin care, compression wraps and /or garments, simple self MLD and lymphatic pumping therex to habituate LE self care protocol  into ADLs for optimal LE self-management over time. Baseline: Dependent Goal status:PROGRESSING  PLAN:  OT FREQUENCY: 2x/week  OT DURATION: 12 weeks and PRN  PLANNED INTERVENTIONS: 97110-Therapeutic exercises, 97530- Therapeutic activity, 97140- Manual therapy, Dry Needling, Manual lymph drainage,  and Compression bandaging Complete Decongestive Therapy: Manual lympathic drainage, skin care,    compression wraps,,  then fit with appropriate compression garments during Self-management Phase.  PLAN FOR NEXT SESSION:   Pt edu for lymphedema self-ca home program.  Loel Dubonnet, MS, OTR/L, CLT-LANA 01/27/24 4:05 PM

## 2024-01-31 ENCOUNTER — Ambulatory Visit: Payer: Medicare PPO | Admitting: Occupational Therapy

## 2024-01-31 DIAGNOSIS — I890 Lymphedema, not elsewhere classified: Secondary | ICD-10-CM

## 2024-01-31 NOTE — Therapy (Signed)
 OUTPATIENT OCCUPATIONAL THERAPY TREATMENT NOTE  BILATERAL LOWER EXTREMITY LYMPHEDEMA   Patient Name: Hayley Barr MRN: [id number] DOB:[birth year], 68 y.o., female Today's Date: 01/31/2024  END OF SESSION:   OT End of Session - 01/31/24 1011     Visit Number 16    Number of Visits 36    Date for OT Re-Evaluation 01/27/24    OT Start Time 1010    OT Stop Time 1110    OT Time Calculation (min) 60 min    Activity Tolerance Patient tolerated treatment well;No increased pain    Behavior During Therapy [facility] for tasks assessed/performed             No past medical history on file. No past surgical history on file. Patient Active Problem List   Diagnosis Date Noted   Foot pain, right 05/15/2022   Benign essential hypertension 09/02/2020   Diabetes (HCC) 09/02/2020   Dyslipidemia, goal LDL below 100 09/02/2020   Esophageal reflux 09/02/2020   Irritable bowel syndrome without diarrhea 09/02/2020   Primary osteoarthritis of knees, bilateral 09/02/2020   Psoriasis 09/02/2020   Vitamin D deficiency 09/02/2020   Abscess and cellulitis 02/08/2015   Recurrent umbilical hernia 10/23/2014   Hair loss 09/10/2014   Ventral hernia 09/10/2014    PCP: Hayley Reap, MD  REFERRING PROVIDER: Alvester Morin, MD  REFERRING DIAG: 189.0  THERAPY DIAG:  Lymphedema, not elsewhere classified  Rationale for Evaluation and Treatment: Rehabilitation  ONSET DATE: 1.5 yrs after an episode of shingles  SUBJECTIVE:                                                                                                                                                                                           SUBJECTIVE STATEMENT: Hayley Barr presents to Occupational Therapy for BLE lymphedema. Pt arrives seated in transport wc. Pt denies LE lymphedema-related pain today. She tells me she was diligent with compression wraps to R leg between visits.  PERTINENT HISTORY: HTN, OA, DM type 2,  pre-existing B leg and foot lymphedema 2/2 suspected venous insufficiency, R>L. R leg Fx with reconstruction of Achilles;  chronic plantar  fasciitis, Negative for DVT in 1923, Hx R foot abscess and cellulitis, IBS, Psoriasis, bilateral neuropathy feet and legs, currently has PT 2 x weekly   PAIN:  Are you having pain? no, L leg and knee leg pain not rated/10 Location: B legs, feet and ankles, R>L Description: heaviness, stiffness, tightness What makes it better? elevation What makes it worse? Standing, walking, dependent sitting  PRECAUTIONS: Fall Risk;  Infection Risk, LYMPHEDEMA precautions, DM type 2 precautions  WEIGHT BEARING RESTRICTIONS: No  FALLS:  Has patient fallen in last 6 months? Yes. 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  COGNITION:  Overall cognitive status: Within functional limits for tasks assessed   OBSERVATIONS / OTHER ASSESSMENTS:  Mild-moderate, BLE lymphedema 2/2 suspected venous insufficiency and obesity ( weight induced lymphedema.  POSTURE: WFL  LE ROM: WFL for  clinical tasks  LE MMT: WFL for clinical task  LYMPHEDEMA ASSESSMENTS:   SURGERY TYPE/DATE: N/A, non cancer related  Hx INFECTIONS: NONE     Hx WOUNDS: currently has a dime sized non healing wound at base of R heel incision from achilles reconstruction   FOTO functional outcome measure: Initial 09/29/23:  50%  Lymphedema Life Impact Scale (LLIS) Initial 09/29/23: 47.06%  BLE COMPARATIVE LIMB VOLUMETRICS:   11/04/23 Initial  LANDMARK RIGHT   R LEG (A-D) 6686.8 ml  R THIGH (E-G) ml  R FULL LIMB (A-G) ml  Limb Volume differential (LVD)  0.5 %, L>R  Volume change since initial %  Volume change overall V  (Blank rows = not tested)  LANDMARK LEFT   L LEG (A-D) 6743.7 ml  L THIGH (E-G) ml  L FULL LIMB (A-G) ml  Limb Volume differential (LVD)  %  Volume change since initial %  Volume change overall %   (Blank rows = not tested)  6th visit RLE 12/10/23  LANDMARK RIGHT   R LEG (A-D) 5451.6 ml  R THIGH (E-G) ml  R FULL LIMB (A-G) ml  Limb Volume differential (LVD)  %  Volume change since initial  DEC 18.47% since 11/04/23  Volume change overall V  (Blank rows = not tested)   9th visit RLE 12/24/23  [facility] RIGHT   R LEG (A-D) 5451.6 ml  R THIGH (E-G) ml  R FULL LIMB (A-G) ml  Limb Volume differential (LVD)  %  Volume change since initial DECREASED 22.4% overall  Volume change since 12/10/23 DECREASED 4.8%  (Blank rows = not tested) GAIT: Distance walked: arrived in transport wc. Able to transfer to Rx bed and back to transport wc using Single point cane (modified independence)  TODAY'S TREATMENT:                                                                                                                                         MLD to LLE/LLQ Pt/ family education re  LE self-care Multilayer  compression wraps  from toes to knee on L  PATIENT EDUCATION: Simple self-MLD handout given. Continued Pt/ CG edu for lymphedema self care home program throughout session. Topics include outcome of comparative limb volumetrics- starting limb volume differentials (LVDs), technology and gradient techniques used for short stretch, multilayer compression wrapping, simple self-MLD, therapeutic lymphatic pumping exercises, skin/nail care, LE precautions,. compression garment recommendations  and specifications, wear and care schedule and compression garment donning / doffing w assistive devices. Discussed progress towards all OT goals since commencing CDT. All questions answered to the Pt's satisfaction. Good return. Person educated: Patient and family Education method: Explanation, Demonstration, and Handouts Education comprehension: verbalized understanding, returned demonstration, verbal cues required, and needs further education  HOME EXERCISE PROGRAM: BLE lymphatic pumping there ex using- 1 set of 10 reps, each exercise in order-  1-2 x daily, bilaterally Simple self MLD 1 x daily Daily skin care and inspection to increase hydration, skin mobility and decrease infection risk- can be done during MLD Compression wraps 23/7 until garment fitting complete (multilayer compression wraps to R LEG using 1 each of 8, 10, and 12 cm wide short stretch compression wraps over single layer of stockinett and Rosidal Soft foam ( 0.4 cm thick) . Finished wraps with stretch net to keep tape in place. )  ASSESSMENT: CLINICAL IMPRESSION: Pt tolerated MLD and compression wrapping today without increased pain. L Leg volume continues to decrease slowly and skin condition is  improved in terms of hydration and excursion. Cont as per POC.    (10/29/23 INITIAL EVALUATION:  Taunja Waters is a 68 yo female presenting with mild, stage 2, BLE lymphedema, R>L.  Longstanding, mild, B ankle swelling is exacerbated for the past 1.5 years s/P an episode of shingles affecting the R leg, and a subsequent fall resulting in ankle fracture with Achilles reconstruction and ongoing, non-healing wound at the distal end of surgical scar. This situation is concerning re elevated infection risk from lymphatic dysfunction in the context of DM type II,  and ongoing functional decline due to associated problems with ambulation and functional mobility.  Lymphedema occurs secondary to orthopedic trauma when the injury damages  lymphatic vessels and structures in the area, leading to a buildup of fluid carrying cellular debris, waste products, bacteria, viruses, and damaged or abnormal cells in the tissues causing protein-rich swelling in the affected body part. This secondary lymphedema, often arising from significant soft tissue trauma accompanying the fracture, can potentially delay wound healing and complicate fracture management.   Progressing BLE lymphedema limits Hayley Yeh's functional performance in all occupational domains, including functional ambulation and transfers, standing tolerance, dependent sitting for more than 15 minutes,  basic and instrumental ADLs performance, including lower body dressing, LB bathing, fitting preferred street shoes and LB clothing; driving, shopping, and home management . Lymphedema and associated pain limits Pt's ability to perform productive activities and  leisure pursuits and to participate in socialization at home and in the community.  BLE lymphedema contributes to elevated infection risk and increased falls risk due to body asymmetry. Hayley Hoffert will benefit from skilled OT for Complete Decongestive Therapy (CDT) the gold standard of lymphedema care, including manual lymphatic drainage (MLD), skin care to limit infection risk and increase skin excursion, lymphatic pumping exercise, and during the Intensive Phase multilayer, gradient compression bandaging to reduce limb volume. Once limb volume reduction reaches a clinical plateau custom compression garments that provide appropriate fit, compression and containment are fitted. Throughout the treatment course Pt/ caregiver will learn  lymphedema prevention strategies and precautions, learn to perform all LE self-care home program components. Without skilled OT for lymphedema care, Pt's condition will progress and further functional decline is expected.)    OBJECTIVE IMPAIRMENTS: Abnormal gait, decreased activity tolerance, decreased  balance, decreased knowledge of condition, decreased knowledge of use of DME, decreased mobility, difficulty walking, decreased ROM, decreased strength, increased edema, impaired flexibility, impaired sensation, improper body mechanics, obesity, pain, and chronic , progressive, BLE swelling with related tissue changes and thickening, increased infection risk, non-healing wound .   ACTIVITY LIMITATIONS: Difficulty performing basic and instrumental ADLs (LB dressing, LB bathing,bathing, toileting, dressing, hygiene/grooming, and functional ambulation. Difficulty performing leisure pursuits and productive activities requiring standing and/ or walking for > 19 minutes  PARTICIPATION LIMITATIONS:  Impaired social participation, decreased participation in church  PERSONAL FACTORS: Behavior pattern, Fitness, Past/current experiences, Social background, Time since onset of injury/illness/exacerbation, and 3+ comorbidities: OA, hx R ankle fracture and non-healing wound, hx plantar fasciitis  are also affecting patient's functional outcome.   REHAB POTENTIAL: Good  CLINICAL DECISION MAKING: Evolving/moderate complexity  EVALUATION COMPLEXITY: Moderate   GOALS: Goals reviewed with patient? Yes  SHORT TERM GOALS: Target date: 4th OT Rx visit   Pt will demonstrate understanding of lymphedema precautions and prevention strategies with modified independence using a printed reference to identify at least 5 precautions and discussing how s/he may implement them into daily life to reduce risk of progression with extra time. Baseline:Max A Goal status: GOAL MET  2.  Pt will be able to apply multilayer, knee length, gradient, compression wraps to one leg at a time from toes to below knee with modified independence (extra time) to decrease limb volume, to limit infection risk, and to limit lymphedema progression.  Baseline: Dependent Goal status: GOAL MET  LONG TERM GOALS: Target date: 02/18/24  Given this  patient's Intake score of 50 % on the functional outcomes FOTO tool, patient will experience an increase in function of 5 points to improve basic and instrumental ADLs performance, including lymphedema self-care.  Baseline: 50 Goal status: INITIAL. 12/25/23 GOAL DEFERRED. FOTO tool discontinued.  2.  Given this patient's Intake score of 47.06 % on the Lymphedema Life Impact Scale (LLIS), patient will experience a reduction of at least 5 points in her perceived level of functional impairment resulting from lymphedema to improve functional performance and quality of life (QOL). Baseline: 47.06 % Goal status: PROGRESSING  3.  Pt will achieve at least a 10% volume reduction in B legs to return limb to typical size and shape, to limit infection risk and LE progression, to decrease pain, to improve function. Baseline: Dependent Goal status: Achieved w R LEG w/ overall reduction to date of 22.4%.  4.  Pt will obtain appropriate compression garments/devices and achieve modified independence (extra time + assistive devices) with donning/doffing to optimize limb volume reductions and limit LE progression over time. Baseline: Dependent Goal status: PARTIALLY MET . Fitted RLE garments last session.   During Intensive phase CDT , with modified independence, Pt will achieve at least 85% compliance with all lymphedema self-care home program components, including daily skin care, compression wraps and /or garments, simple self MLD and lymphatic pumping therex to habituate LE self care protocol  into ADLs for optimal LE self-management over time. Baseline: Dependent Goal status:PROGRESSING  PLAN:  OT FREQUENCY: 2x/week  OT DURATION: 12 weeks and PRN  PLANNED INTERVENTIONS: 97110-Therapeutic exercises, 97530- Therapeutic activity, 97140- Manual therapy, Dry Needling, Manual lymph drainage, and Compression bandaging Complete  Decongestive Therapy: Manual lympathic drainage, skin care,    compression wraps,,  then fit with appropriate compression garments during Self-management Phase.  PLAN FOR NEXT SESSION:   Pt edu for lymphedema self-ca home program.  Loel Dubonnet, Hayley, OTR/L, CLT-LANA 01/31/24 12:27 PM

## 2024-02-03 ENCOUNTER — Ambulatory Visit: Payer: Medicare PPO | Admitting: Occupational Therapy

## 2024-02-03 DIAGNOSIS — I890 Lymphedema, not elsewhere classified: Secondary | ICD-10-CM | POA: Diagnosis not present

## 2024-02-03 NOTE — Therapy (Unsigned)
 OUTPATIENT OCCUPATIONAL THERAPY TREATMENT NOTE  BILATERAL LOWER EXTREMITY LYMPHEDEMA   Patient Name: Maree Stipe MRN: [id number] DOB:[birth year], 68 y.o., female Today's Date: 02/04/2024  END OF SESSION:   OT End of Session - 02/03/24 1316     Visit Number 17    Number of Visits 36    Date for OT Re-Evaluation 05/03/24    OT Start Time 0104    OT Stop Time 0209    OT Time Calculation (min) 65 min    Activity Tolerance Patient tolerated treatment well;No increased pain    Behavior During Therapy [facility] for tasks assessed/performed             No past medical history on file. No past surgical history on file. Patient Active Problem List   Diagnosis Date Noted   Foot pain, right 05/15/2022   Benign essential hypertension 09/02/2020   Diabetes (HCC) 09/02/2020   Dyslipidemia, goal LDL below 100 09/02/2020   Esophageal reflux 09/02/2020   Irritable bowel syndrome without diarrhea 09/02/2020   Primary osteoarthritis of knees, bilateral 09/02/2020   Psoriasis 09/02/2020   Vitamin D deficiency 09/02/2020   Abscess and cellulitis 02/08/2015   Recurrent umbilical hernia 10/23/2014   Hair loss 09/10/2014   Ventral hernia 09/10/2014    PCP: Jacinto Reap, MD  REFERRING PROVIDER: Alvester Morin, MD  REFERRING DIAG: 189.0  THERAPY DIAG:  Lymphedema, not elsewhere classified  Rationale for Evaluation and Treatment: Rehabilitation  ONSET DATE: 1.5 yrs after an episode of shingles  SUBJECTIVE:                                                                                                                                                                                           SUBJECTIVE STATEMENT: Ms Hillier presents to Occupational Therapy for BLE lymphedema. Pt arrives seated in transport wc. Pt denies LE lymphedema-related pain today. Pt has no new complaints. We discuss her question about when we know when RLE is reduced as much as it will be and when is  best to order a garment for the RLE during manual therapy.  PERTINENT HISTORY: HTN, OA, DM type 2, pre-existing B leg and foot lymphedema 2/2 suspected venous insufficiency, R>L. R leg Fx with reconstruction of Achilles;  chronic plantar  fasciitis, Negative for DVT in 1923, Hx R foot abscess and cellulitis, IBS, Psoriasis, bilateral neuropathy feet and legs, currently has PT 2 x weekly   PAIN:  Are you having pain? no, L leg and knee leg pain not rated/10 Location: B legs, feet and ankles, R>L Description: heaviness, stiffness, tightness What makes it better? elevation What makes it worse? Standing,  walking, dependent sitting  PRECAUTIONS: Fall Risk;  Infection Risk, LYMPHEDEMA precautions, DM type 2 precautions  WEIGHT BEARING RESTRICTIONS: No  FALLS:  Has patient fallen in last 6 months? Yes. 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  COGNITION:  Overall cognitive status: Within functional limits for tasks assessed   OBSERVATIONS / OTHER ASSESSMENTS:  Mild-moderate, BLE lymphedema 2/2 suspected venous insufficiency and obesity ( weight induced lymphedema.  POSTURE: WFL  LE ROM: WFL for  clinical tasks  LE MMT: WFL for clinical task  LYMPHEDEMA ASSESSMENTS:   SURGERY TYPE/DATE: N/A, non cancer related  Hx INFECTIONS: NONE     Hx WOUNDS: currently has a dime sized non healing wound at base of R  heel incision from achilles reconstruction   FOTO functional outcome measure: Initial 09/29/23: 50%- 02/04/24: FOTO Discontinued  Lymphedema Life Impact Scale (LLIS) Initial 09/29/23: 47.06%  BLE COMPARATIVE LIMB VOLUMETRICS:   11/04/23 Initial  LANDMARK RIGHT   R LEG (A-D) 6686.8 ml  R THIGH (E-G) ml  R FULL LIMB (A-G) ml  Limb Volume differential (LVD)  0.5 %, L>R  Volume change since initial %  Volume change overall V  (Blank rows = not tested)  LANDMARK LEFT   L LEG (A-D) 6743.7 ml  L THIGH (E-G) ml  L FULL LIMB (A-G) ml  Limb Volume differential (LVD)  %  Volume change since initial %  Volume change overall %   (Blank rows = not tested)  6th visit RLE 12/10/23  LANDMARK RIGHT   R LEG (A-D) 5451.6 ml  R THIGH (E-G) ml  R FULL LIMB (A-G) ml  Limb Volume differential (LVD)  %  Volume change since initial  DEC 18.47% since 11/04/23  Volume change overall V  (Blank rows = not tested)   9th visit RLE 12/24/23  [facility] RIGHT   R LEG (A-D) 5451.6 ml  R THIGH (E-G) ml  R FULL LIMB (A-G) ml  Limb Volume differential (LVD)  %  Volume change since initial DECREASED 22.4% overall  Volume change since 12/10/23 DECREASED 4.8%  (Blank rows = not tested) GAIT: Distance walked: arrived in transport wc. Able to transfer to Rx bed and back to transport wc using Single point cane (modified independence)  TODAY'S TREATMENT:  MLD to LLE/LLQ Pt/ family education re  LE self-care Multilayer compression wraps  from toes to knee on L  PATIENT EDUCATION: Simple self-MLD handout given. Continued Pt/ CG edu for lymphedema self care home program throughout session. Topics include outcome of comparative limb volumetrics- starting limb volume differentials (LVDs), technology and gradient techniques used for short stretch, multilayer compression wrapping,  simple self-MLD, therapeutic lymphatic pumping exercises, skin/nail care, LE precautions,. compression garment recommendations and specifications, wear and care schedule and compression garment donning / doffing w assistive devices. Discussed progress towards all OT goals since commencing CDT. All questions answered to the Pt's satisfaction. Good return. Person educated: Patient and family Education method: Explanation, Demonstration, and Handouts Education comprehension: verbalized understanding, returned demonstration, verbal cues required, and needs further education  HOME EXERCISE PROGRAM: BLE lymphatic pumping there ex using- 1 set of 10 reps, each exercise in order-  1-2 x daily, bilaterally Simple self MLD 1 x daily Daily skin care and inspection to increase hydration, skin mobility and decrease infection risk- can be done during MLD Compression wraps 23/7 until garment fitting complete (multilayer compression wraps to R LEG using 1 each of 8, 10, and 12 cm wide short stretch compression wraps over single layer of stockinett and Rosidal Soft foam ( 0.4 cm thick) . Finished wraps with stretch net to keep tape in place. )  ASSESSMENT: CLINICAL IMPRESSION: Pt tolerated MLD to LLE as established, and compression wrapping bellow the R knee without increased pain. R Leg volume continues to decrease slowly and skin condition to improve. Pronounced fat pad at lateral malleolus is largely unchanged, despite Comprex "cigarette foam" pad placed on it beneath compression wraps.  Pt agrees that R leg remains congested at upper calf area, despite some reduction. We agreed to plan to reduce OT frequency to 1 x weekly to conserve visits and finances. We spent extra time teaching self MLD again today. Cont as per POC.    (10/29/23 INITIAL EVALUATION:  Denecia Heron is a 68 yo female presenting with mild, stage 2, BLE lymphedema, R>L.  Longstanding, mild, B ankle swelling is exacerbated for the past 1.5 years  s/P an episode of shingles affecting the R leg, and a subsequent fall resulting in ankle fracture with Achilles reconstruction and ongoing, non-healing wound at the distal end of surgical scar. This situation is concerning re elevated infection risk from lymphatic dysfunction in the context of DM type II,  and ongoing functional decline due to associated problems with ambulation and functional mobility.  Lymphedema occurs secondary to orthopedic trauma when the injury damages lymphatic vessels and structures in the area, leading to a buildup of fluid carrying cellular debris, waste products, bacteria, viruses, and damaged or abnormal cells in the tissues causing protein-rich swelling in the affected body part. This secondary lymphedema, often arising from significant soft tissue trauma accompanying the fracture, can potentially delay wound healing and complicate fracture management.   Progressing BLE lymphedema limits Ms Lyall's functional performance in all occupational domains, including functional ambulation and transfers, standing tolerance, dependent sitting for more than 15 minutes,  basic and instrumental ADLs performance, including lower body dressing, LB bathing, fitting preferred street shoes and LB clothing; driving, shopping, and home management . Lymphedema and associated pain limits Pt's ability to perform productive activities and  leisure pursuits and to participate in socialization at home and in the community.  BLE lymphedema contributes to elevated infection risk and increased falls risk due to body asymmetry. Ms Brailsford will benefit from  skilled OT for Complete Decongestive Therapy (CDT) the gold standard of lymphedema care, including manual lymphatic drainage (MLD), skin care to limit infection risk and increase skin excursion, lymphatic pumping exercise, and during the Intensive Phase multilayer, gradient compression bandaging to reduce limb volume. Once limb volume reduction  reaches a clinical plateau custom compression garments that provide appropriate fit, compression and containment are fitted. Throughout the treatment course Pt/ caregiver will learn lymphedema prevention strategies and precautions, learn to perform all LE self-care home program components. Without skilled OT for lymphedema care, Pt's condition will progress and further functional decline is expected.)    OBJECTIVE IMPAIRMENTS: Abnormal gait, decreased activity tolerance, decreased balance, decreased knowledge of condition, decreased knowledge of use of DME, decreased mobility, difficulty walking, decreased ROM, decreased strength, increased edema, impaired flexibility, impaired sensation, improper body mechanics, obesity, pain, and chronic , progressive, BLE swelling with related tissue changes and thickening, increased infection risk, non-healing wound .   ACTIVITY LIMITATIONS: Difficulty performing basic and instrumental ADLs (LB dressing, LB bathing,bathing, toileting, dressing, hygiene/grooming, and functional ambulation. Difficulty performing leisure pursuits and productive activities requiring standing and/ or walking for > 19 minutes  PARTICIPATION LIMITATIONS:  Impaired social participation, decreased participation in church  PERSONAL FACTORS: Behavior pattern, Fitness, Past/current experiences, Social background, Time since onset of injury/illness/exacerbation, and 3+ comorbidities: OA, hx R ankle fracture and non-healing wound, hx plantar fasciitis  are also affecting patient's functional outcome.   REHAB POTENTIAL: Good  CLINICAL DECISION MAKING: Evolving/moderate complexity  EVALUATION COMPLEXITY: Moderate   GOALS: Goals reviewed with patient? Yes  SHORT TERM GOALS: Target date: 4th OT Rx visit   Pt will demonstrate understanding of lymphedema precautions and prevention strategies with modified independence using a printed reference to identify at least 5 precautions and  discussing how s/he may implement them into daily life to reduce risk of progression with extra time. Baseline:Max A Goal status: GOAL MET  2.  Pt will be able to apply multilayer, knee length, gradient, compression wraps to one leg at a time from toes to below knee with modified independence (extra time) to decrease limb volume, to limit infection risk, and to limit lymphedema progression.  Baseline: Dependent Goal status: GOAL MET  LONG TERM GOALS: Target date: 02/18/24  Given this patient's Intake score of 50 % on the functional outcomes FOTO tool, patient will experience an increase in function of 5 points to improve basic and instrumental ADLs performance, including lymphedema self-care.  Baseline: 50 Goal status: INITIAL. 12/25/23 GOAL DEFERRED. FOTO tool discontinued.  2.  Given this patient's Intake score of 47.06 % on the Lymphedema Life Impact Scale (LLIS), patient will experience a reduction of at least 5 points in her perceived level of functional impairment resulting from lymphedema to improve functional performance and quality of life (QOL). Baseline: 47.06 % Goal status: PROGRESSING  3.  Pt will achieve at least a 10% volume reduction in B legs to return limb to typical size and shape, to limit infection risk and LE progression, to decrease pain, to improve function. Baseline: Dependent Goal status: Achieved w R LEG w/ overall reduction to date of 22.4%.  4.  Pt will obtain appropriate compression garments/devices and achieve modified independence (extra time + assistive devices) with donning/doffing to optimize limb volume reductions and limit LE progression over time. Baseline: Dependent Goal status: PARTIALLY MET . Fitted RLE garments last session.   During Intensive phase CDT , with modified independence, Pt will achieve at least 85% compliance with all lymphedema  self-care home program components, including daily skin care, compression wraps and /or garments, simple self  MLD and lymphatic pumping therex to habituate LE self care protocol  into ADLs for optimal LE self-management over time. Baseline: Dependent Goal status:PROGRESSING  PLAN:  OT FREQUENCY: 2x/week  OT DURATION: 12 weeks and PRN  PLANNED INTERVENTIONS: 97110-Therapeutic exercises, 97530- Therapeutic activity, 97140- Manual therapy, Dry Needling, Manual lymph drainage, and Compression bandaging Complete Decongestive Therapy: Manual lympathic drainage, skin care,    compression wraps,, then fit with appropriate compression garments during Self-management Phase.  PLAN FOR NEXT SESSION:   Pt edu for lymphedema self-ca home program.  Loel Dubonnet, MS, OTR/L, CLT-LANA 02/04/24 8:16 AM

## 2024-02-04 NOTE — Addendum Note (Signed)
 Addended by: Judithann Sauger on: 02/04/2024 11:20 AM   Modules accepted: Orders

## 2024-02-05 ENCOUNTER — Encounter: Payer: Medicare PPO | Admitting: Occupational Therapy

## 2024-02-06 ENCOUNTER — Encounter: Admitting: Occupational Therapy

## 2024-02-07 ENCOUNTER — Ambulatory Visit: Admitting: Occupational Therapy

## 2024-02-07 ENCOUNTER — Encounter: Payer: Self-pay | Admitting: Occupational Therapy

## 2024-02-07 DIAGNOSIS — I890 Lymphedema, not elsewhere classified: Secondary | ICD-10-CM | POA: Diagnosis not present

## 2024-02-07 NOTE — Therapy (Signed)
 OUTPATIENT OCCUPATIONAL THERAPY TREATMENT NOTE  BILATERAL LOWER EXTREMITY LYMPHEDEMA   Patient Name: Haidee Thakur MRN: [id number] DOB:01-30-1956, 68 y.o., female Today's Date: 02/07/2024  END OF SESSION:   OT End of Session - 02/07/24 1011     Visit Number 18    Number of Visits 36    Date for OT Re-Evaluation 05/03/24    OT Start Time 1003    OT Stop Time 1058    OT Time Calculation (min) 55 min    Activity Tolerance Patient tolerated treatment well;No increased pain    Behavior During Therapy [facility] for tasks assessed/performed             History reviewed. No pertinent past medical history. History reviewed. No pertinent surgical history. Patient Active Problem List   Diagnosis Date Noted   Foot pain, right 05/15/2022   Benign essential hypertension 09/02/2020   Diabetes (HCC) 09/02/2020   Dyslipidemia, goal LDL below 100 09/02/2020   Esophageal reflux 09/02/2020   Irritable bowel syndrome without diarrhea 09/02/2020   Primary osteoarthritis of knees, bilateral 09/02/2020   Psoriasis 09/02/2020   Vitamin D deficiency 09/02/2020   Abscess and cellulitis 02/08/2015   Recurrent umbilical hernia 10/23/2014   Hair loss 09/10/2014   Ventral hernia 09/10/2014    PCP: Jacinto Reap, MD  REFERRING PROVIDER: Alvester Morin, MD  REFERRING DIAG: 189.0  THERAPY DIAG:  Lymphedema, not elsewhere classified  Rationale for Evaluation and Treatment: Rehabilitation  ONSET DATE: 1.5 yrs after an episode of shingles  SUBJECTIVE:                                                                                                                                                                                           SUBJECTIVE STATEMENT: Ms Quale presents to Occupational Therapy for BLE lymphedema. Pt arrives seated in transport wc. Pt denies LE lymphedema-related pain today. Pt has no new complaints. We discuss her question about when we know when LLE is  reduced as much as it will be and when is best to order a garment for the RLE during manual therapy. We agreed to check volumetrics on 20th visit and decide based on that data.  PERTINENT HISTORY: HTN, OA, DM type 2, pre-existing B leg and foot lymphedema 2/2 suspected venous insufficiency, R>L. R leg Fx with reconstruction of Achilles;  chronic plantar  fasciitis, Negative for DVT in 1923, Hx R foot abscess and cellulitis, IBS, Psoriasis, bilateral neuropathy feet and legs, currently has PT 2 x weekly   PAIN:  Are you having pain? no, L leg and knee leg pain not rated/10 Location: B legs, feet and ankles,  R>L Description: heaviness, stiffness, tightness What makes it better? elevation What makes it worse? Standing, walking, dependent sitting  PRECAUTIONS: Fall Risk;  Infection Risk, LYMPHEDEMA precautions, DM type 2 precautions  WEIGHT BEARING RESTRICTIONS: No  FALLS:  Has patient fallen in last 6 months? Yes. 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  COGNITION:  Overall cognitive status: Within functional limits for tasks assessed   OBSERVATIONS / OTHER ASSESSMENTS:  Mild-moderate, BLE lymphedema 2/2 suspected venous insufficiency and obesity ( weight induced lymphedema.  POSTURE: WFL  LE ROM: WFL for  clinical tasks  LE MMT: WFL for clinical task  LYMPHEDEMA ASSESSMENTS:   SURGERY TYPE/DATE: N/A,  non cancer related  Hx INFECTIONS: NONE     Hx WOUNDS: currently has a dime sized non healing wound at base of R heel incision from achilles reconstruction   FOTO functional outcome measure: Initial 09/29/23: 50%- 02/04/24: FOTO Discontinued  Lymphedema Life Impact Scale (LLIS) Initial 09/29/23: 47.06%  BLE COMPARATIVE LIMB VOLUMETRICS:   11/04/23 Initial  LANDMARK RIGHT   R LEG (A-D) 6686.8 ml  R THIGH (E-G) ml  R FULL LIMB (A-G) ml  Limb Volume differential (LVD)  0.5 %, L>R  Volume change since initial %  Volume change overall V  (Blank rows = not tested)  LANDMARK LEFT   L LEG (A-D) 6743.7 ml  L THIGH (E-G) ml  L FULL LIMB (A-G) ml  Limb Volume differential (LVD)  %  Volume change since initial %  Volume change overall %   (Blank rows = not tested)  6th visit RLE 12/10/23  LANDMARK RIGHT   R LEG (A-D) 5451.6 ml  R THIGH (E-G) ml  R FULL LIMB (A-G) ml  Limb Volume differential (LVD)  %  Volume change since initial  DEC 18.47% since 11/04/23  Volume change overall V  (Blank rows = not tested)   9th visit RLE 12/24/23  [facility] RIGHT   R LEG (A-D) 5451.6 ml  R THIGH (E-G) ml  R FULL LIMB (A-G) ml  Limb Volume differential (LVD)  %  Volume change since initial DECREASED 22.4% overall  Volume change since 12/10/23 DECREASED 4.8%  (Blank rows = not tested) GAIT: Distance walked: arrived in transport wc. Able to transfer to Rx bed and back to transport wc using Single point cane (modified independence)  TODAY'S TREATMENT:  MLD to LLE/LLQ Pt/ family education re  LE self-care Multilayer compression wraps  from toes to knee on L  PATIENT EDUCATION: Simple self-MLD handout given. Continued Pt/ CG edu for lymphedema self care home program throughout session. Topics include outcome of comparative limb volumetrics- starting limb volume  differentials (LVDs), technology and gradient techniques used for short stretch, multilayer compression wrapping, simple self-MLD, therapeutic lymphatic pumping exercises, skin/nail care, LE precautions,. compression garment recommendations and specifications, wear and care schedule and compression garment donning / doffing w assistive devices. Discussed progress towards all OT goals since commencing CDT. All questions answered to the Pt's satisfaction. Good return. Person educated: Patient and family Education method: Explanation, Demonstration, and Handouts Education comprehension: verbalized understanding, returned demonstration, verbal cues required, and needs further education  HOME EXERCISE PROGRAM: BLE lymphatic pumping there ex using- 1 set of 10 reps, each exercise in order-  1-2 x daily, bilaterally Simple self MLD 1 x daily Daily skin care and inspection to increase hydration, skin mobility and decrease infection risk- can be done during MLD Compression wraps 23/7 until garment fitting complete (multilayer compression wraps to R LEG using 1 each of 8, 10, and 12 cm wide short stretch compression wraps over single layer of stockinett and Rosidal Soft foam ( 0.4 cm thick) . Finished wraps with stretch net to keep tape in place. )  ASSESSMENT: CLINICAL IMPRESSION: Pt tolerated MLD to LLE as established, and compression wrapping bellow the L knee without increased pain. L Leg volume continues to decrease slowly and skin condition to improve. Pronounced fat pad at lateral malleolus is largely unchanged, despite Comprex "cigarette foam" pad placed on it beneath compression wraps. Pt agrees that L leg remains congested at upper calf area, despite some reduction. We agreed to plan to reduce OT frequency to 1 x weekly to conserve visits and finances.  Cont as per POC.   (10/29/23 INITIAL EVALUATION:  Loda Wieting is a 68 yo female presenting with mild, stage 2, BLE lymphedema, R>L.  Longstanding,  mild, B ankle swelling is exacerbated for the past 1.5 years s/P an episode of shingles affecting the R leg, and a subsequent fall resulting in ankle fracture with Achilles reconstruction and ongoing, non-healing wound at the distal end of surgical scar. This situation is concerning re elevated infection risk from lymphatic dysfunction in the context of DM type II,  and ongoing functional decline due to associated problems with ambulation and functional mobility.  Lymphedema occurs secondary to orthopedic trauma when the injury damages lymphatic vessels and structures in the area, leading to a buildup of fluid carrying cellular debris, waste products, bacteria, viruses, and damaged or abnormal cells in the tissues causing protein-rich swelling in the affected body part. This secondary lymphedema, often arising from significant soft tissue trauma accompanying the fracture, can potentially delay wound healing and complicate fracture management.   Progressing BLE lymphedema limits Ms Tatham's functional performance in all occupational domains, including functional ambulation and transfers, standing tolerance, dependent sitting for more than 15 minutes,  basic and instrumental ADLs performance, including lower body dressing, LB bathing, fitting preferred street shoes and LB clothing; driving, shopping, and home management . Lymphedema and associated pain limits Pt's ability to perform productive activities and  leisure pursuits and to participate in socialization at home and in the community.  BLE lymphedema contributes to elevated infection risk and increased falls risk due to body asymmetry. Ms Delorenzo will benefit from skilled OT for Complete Decongestive Therapy (CDT) the gold standard  of lymphedema care, including manual lymphatic drainage (MLD), skin care to limit infection risk and increase skin excursion, lymphatic pumping exercise, and during the Intensive Phase multilayer, gradient compression  bandaging to reduce limb volume. Once limb volume reduction reaches a clinical plateau custom compression garments that provide appropriate fit, compression and containment are fitted. Throughout the treatment course Pt/ caregiver will learn lymphedema prevention strategies and precautions, learn to perform all LE self-care home program components. Without skilled OT for lymphedema care, Pt's condition will progress and further functional decline is expected.)    OBJECTIVE IMPAIRMENTS: Abnormal gait, decreased activity tolerance, decreased balance, decreased knowledge of condition, decreased knowledge of use of DME, decreased mobility, difficulty walking, decreased ROM, decreased strength, increased edema, impaired flexibility, impaired sensation, improper body mechanics, obesity, pain, and chronic , progressive, BLE swelling with related tissue changes and thickening, increased infection risk, non-healing wound .   ACTIVITY LIMITATIONS: Difficulty performing basic and instrumental ADLs (LB dressing, LB bathing,bathing, toileting, dressing, hygiene/grooming, and functional ambulation. Difficulty performing leisure pursuits and productive activities requiring standing and/ or walking for > 19 minutes  PARTICIPATION LIMITATIONS:  Impaired social participation, decreased participation in church  PERSONAL FACTORS: Behavior pattern, Fitness, Past/current experiences, Social background, Time since onset of injury/illness/exacerbation, and 3+ comorbidities: OA, hx R ankle fracture and non-healing wound, hx plantar fasciitis  are also affecting patient's functional outcome.   REHAB POTENTIAL: Good  CLINICAL DECISION MAKING: Evolving/moderate complexity  EVALUATION COMPLEXITY: Moderate   GOALS: Goals reviewed with patient? Yes  SHORT TERM GOALS: Target date: 4th OT Rx visit   Pt will demonstrate understanding of lymphedema precautions and prevention strategies with modified independence using a  printed reference to identify at least 5 precautions and discussing how s/he may implement them into daily life to reduce risk of progression with extra time. Baseline:Max A Goal status: GOAL MET  2.  Pt will be able to apply multilayer, knee length, gradient, compression wraps to one leg at a time from toes to below knee with modified independence (extra time) to decrease limb volume, to limit infection risk, and to limit lymphedema progression.  Baseline: Dependent Goal status: GOAL MET  LONG TERM GOALS: Target date: 02/18/24  Given this patient's Intake score of 50 % on the functional outcomes FOTO tool, patient will experience an increase in function of 5 points to improve basic and instrumental ADLs performance, including lymphedema self-care.  Baseline: 50 Goal status: INITIAL. 12/25/23 GOAL DEFERRED. FOTO tool discontinued.  2.  Given this patient's Intake score of 47.06 % on the Lymphedema Life Impact Scale (LLIS), patient will experience a reduction of at least 5 points in her perceived level of functional impairment resulting from lymphedema to improve functional performance and quality of life (QOL). Baseline: 47.06 % Goal status: PROGRESSING  3.  Pt will achieve at least a 10% volume reduction in B legs to return limb to typical size and shape, to limit infection risk and LE progression, to decrease pain, to improve function. Baseline: Dependent Goal status: Achieved w R LEG w/ overall reduction to date of 22.4%.  4.  Pt will obtain appropriate compression garments/devices and achieve modified independence (extra time + assistive devices) with donning/doffing to optimize limb volume reductions and limit LE progression over time. Baseline: Dependent Goal status: PARTIALLY MET . Fitted RLE garments last session.   During Intensive phase CDT , with modified independence, Pt will achieve at least 85% compliance with all lymphedema self-care home program components, including daily  skin care, compression  wraps and /or garments, simple self MLD and lymphatic pumping therex to habituate LE self care protocol  into ADLs for optimal LE self-management over time. Baseline: Dependent Goal status:PROGRESSING  PLAN:  OT FREQUENCY: 2x/week  OT DURATION: 12 weeks and PRN  PLANNED INTERVENTIONS: 97110-Therapeutic exercises, 97530- Therapeutic activity, 97140- Manual therapy, Dry Needling, Manual lymph drainage, and Compression bandaging Complete Decongestive Therapy: Manual lympathic drainage, skin care,    compression wraps,, then fit with appropriate compression garments during Self-management Phase.  PLAN FOR NEXT SESSION:   Pt edu for lymphedema self-ca home program.  Loel Dubonnet, MS, OTR/L, CLT-LANA 02/07/24 12:45 PM

## 2024-02-10 ENCOUNTER — Ambulatory Visit: Payer: Medicare PPO | Admitting: Occupational Therapy

## 2024-02-12 ENCOUNTER — Ambulatory Visit: Payer: Medicare PPO | Admitting: Occupational Therapy

## 2024-02-12 DIAGNOSIS — I890 Lymphedema, not elsewhere classified: Secondary | ICD-10-CM

## 2024-02-12 NOTE — Therapy (Unsigned)
 OUTPATIENT OCCUPATIONAL THERAPY TREATMENT NOTE  BILATERAL LOWER EXTREMITY LYMPHEDEMA   Patient Name: Hayley Barr MRN: [id number] DOB:1955-12-10, 68 y.o., female Today's Date: 02/14/2024  END OF SESSION:   OT End of Session - 02/14/24 0929     Visit Number 19    Number of Visits 36    Date for OT Re-Evaluation 05/03/24    OT Start Time 1110    OT Stop Time 1210    OT Time Calculation (min) 60 min    Activity Tolerance Patient tolerated treatment well;No increased pain    Behavior During Therapy [facility] for tasks assessed/performed             History reviewed. No pertinent past medical history. History reviewed. No pertinent surgical history. Patient Active Problem List   Diagnosis Date Noted   Foot pain, right 05/15/2022   Benign essential hypertension 09/02/2020   Diabetes (HCC) 09/02/2020   Dyslipidemia, goal LDL below 100 09/02/2020   Esophageal reflux 09/02/2020   Irritable bowel syndrome without diarrhea 09/02/2020   Primary osteoarthritis of knees, bilateral 09/02/2020   Psoriasis 09/02/2020   Vitamin D deficiency 09/02/2020   Abscess and cellulitis 02/08/2015   Recurrent umbilical hernia 10/23/2014   Hair loss 09/10/2014   Ventral hernia 09/10/2014    PCP: Jacinto Reap, MD  REFERRING PROVIDER: Alvester Morin, MD  REFERRING DIAG: 189.0  THERAPY DIAG:  Lymphedema, not elsewhere classified  Rationale for Evaluation and Treatment: Rehabilitation  ONSET DATE: 1.5 yrs after an episode of shingles  SUBJECTIVE:                                                                                                                                                                                           SUBJECTIVE STATEMENT: Hayley Barr presents to Occupational Therapy for BLE lymphedema. Pt arrives seated in transport wc. Pt denies LE lymphedema-related pain today. Pt has no new complaints. We agreed to check volumetrics today in prep for upcoming  progress report and to inform our decision about when to measure for custom LLE compression garment.Marland Kitchen  PERTINENT HISTORY: HTN, OA, DM type 2, pre-existing B leg and foot lymphedema 2/2 suspected venous insufficiency, R>L. R leg Fx with reconstruction of Achilles;  chronic plantar  fasciitis, Negative for DVT in 1923, Hx R foot abscess and cellulitis, IBS, Psoriasis, bilateral neuropathy feet and legs, currently has PT 2 x weekly   PAIN:  Are you having pain? no, L leg and knee leg pain not rated/10 Location: B legs, feet and ankles, R>L Description: heaviness, stiffness, tightness What makes it better? elevation What makes it worse? Standing, walking, dependent sitting  PRECAUTIONS:  Fall Risk;  Infection Risk, LYMPHEDEMA precautions, DM type 2 precautions  WEIGHT BEARING RESTRICTIONS: No  FALLS:  Has patient fallen in last 6 months? Yes. 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  COGNITION:  Overall cognitive status: Within functional limits for tasks assessed   OBSERVATIONS / OTHER ASSESSMENTS:  Mild-moderate, BLE lymphedema 2/2 suspected venous insufficiency and obesity ( weight induced lymphedema.  POSTURE: WFL  LE ROM: WFL for  clinical tasks  LE MMT: WFL for clinical task  LYMPHEDEMA ASSESSMENTS:   SURGERY TYPE/DATE: N/A, non cancer related  Hx INFECTIONS: NONE     Hx WOUNDS: currently has a  dime sized non healing wound at base of R heel incision from achilles reconstruction   FOTO functional outcome measure: Initial 09/29/23: 50%- 02/04/24: FOTO Discontinued  Lymphedema Life Impact Scale (LLIS) Initial 09/29/23: 47.06%  BLE COMPARATIVE LIMB VOLUMETRICS:   11/04/23 Initial  LANDMARK RIGHT   R LEG (A-D) 6686.8 ml  R THIGH (E-G) ml  R FULL LIMB (A-G) ml  Limb Volume differential (LVD)  0.5 %, L>R  Volume change since initial %  Volume change overall V  (Blank rows = not tested)  LANDMARK LEFT   L LEG (A-D) 6743.7 ml  L THIGH (E-G) ml  L FULL LIMB (A-G) ml  Limb Volume differential (LVD)  %  Volume change since initial %  Volume change overall %   (Blank rows = not tested)  6th visit RLE 12/10/23  LANDMARK RIGHT   R LEG (A-D) 5451.6 ml  R THIGH (E-G) ml  R FULL LIMB (A-G) ml  Limb Volume differential (LVD)  %  Volume change since initial  DEC 18.47% since 11/04/23  Volume change overall V  (Blank rows = not tested)   9th visit RLE 12/24/23  [facility] RIGHT   R LEG (A-D) 5451.6 ml  R THIGH (E-G) ml  R FULL LIMB (A-G) ml  Limb Volume differential (LVD)  %  Volume change since initial DECREASED 22.4% overall  Volume change since 12/10/23 DECREASED 4.8%  (Blank rows = not tested)  LLE COMPARATIVE LIMB VOLUMETRICS:   02/12/24 19 th visit ( last measured LE at initial Rx session on 11/04/23)   LANDMARK LEFT   L LEG (A-D) 5598.5 ml  L THIGH (E-G) ml  L FULL LIMB (A-G) ml  Limb Volume differential (LVD)   7.8%, L>R%  Volume change since initial  DECREASED 16.98%%  Volume change overall %   (Blank rows = not tested)  GAIT: Distance walked: arrived in transport wc. Able to transfer to Rx bed and back to transport wc using Single point cane (modified independence)  TODAY'S TREATMENT:  LLE comparative limb volumetrics Pt/  family education re  LE self-care Multilayer compression wraps  from toes to below knee on L  PATIENT EDUCATION:  Continued Pt/ CG edu for lymphedema self care home program throughout session. Topics include outcome of comparative limb volumetrics- starting limb volume differentials (LVDs), technology and gradient techniques used for short stretch, multilayer compression wrapping, simple self-MLD, therapeutic lymphatic pumping exercises, skin/nail care, LE precautions,. compression garment recommendations and specifications, wear and care schedule and compression garment donning / doffing w assistive devices. Discussed progress towards all OT goals since commencing CDT. All questions answered to the Pt's satisfaction. Good return. Person educated: Patient and family Education method: Explanation, Demonstration, and Handouts Education comprehension: verbalized understanding, returned demonstration, verbal cues required, and needs further education  HOME EXERCISE PROGRAM: BLE lymphatic pumping there ex using- 1 set of 10 reps, each exercise in order-  1-2 x daily, bilaterally Simple self MLD 1 x daily Daily skin care and inspection to increase hydration, skin mobility and decrease infection risk- can be done during MLD Compression wraps 23/7 until garment fitting complete (multilayer compression wraps to R LEG using 1 each of 8, 10, and 12 cm wide short stretch compression wraps over single layer of stockinett and Rosidal Soft foam ( 0.4 cm thick) . Finished wraps with stretch net to keep tape in place. )  ASSESSMENT: CLINICAL IMPRESSION: LLE comparative limb volumetrics reveal excellent response to OT and CDT. L LEG is decreased by 16.98% since initially measured, and LVD is changed from 0.5%, L>R initially, to 7.8%, L>R. Pt is R dominant. Pt and OT agree to cont Intensive Phase LLE CDT 1 more week in effort to reduce LLE volume a bit more to achieve more typical LVD of ~ 2-3%. Compression wraps  applied as established. Cont OT frequency recently decreased to 1 x weekly to conserve visits and finances.  Cont as per POC.   (10/29/23 INITIAL EVALUATION:  Catelynn Abdallah is a 68 yo female presenting with mild, stage 2, BLE lymphedema, R>L.  Longstanding, mild, B ankle swelling is exacerbated for the past 1.5 years s/P an episode of shingles affecting the R leg, and a subsequent fall resulting in ankle fracture with Achilles reconstruction and ongoing, non-healing wound at the distal end of surgical scar. This situation is concerning re elevated infection risk from lymphatic dysfunction in the context of DM type II,  and ongoing functional decline due to associated problems with ambulation and functional mobility.  Lymphedema occurs secondary to orthopedic trauma when the injury damages lymphatic vessels and structures in the area, leading to a buildup of fluid carrying cellular debris, waste products, bacteria, viruses, and damaged or abnormal cells in the tissues causing protein-rich swelling in the affected body part. This secondary lymphedema, often arising from significant soft tissue trauma accompanying the fracture, can potentially delay wound healing and complicate fracture management.   Progressing BLE lymphedema limits Hayley Meda's functional performance in all occupational domains, including functional ambulation and transfers, standing tolerance, dependent sitting for more than 15 minutes,  basic and instrumental ADLs performance, including lower body dressing, LB bathing, fitting preferred street shoes and LB clothing; driving, shopping, and home management . Lymphedema and associated pain limits Pt's ability to perform productive activities and  leisure pursuits and to participate in socialization at home and in the community.  BLE lymphedema contributes to elevated infection risk and increased falls risk due to body asymmetry. Hayley Ide will benefit from skilled OT for Complete  Decongestive Therapy (  CDT) the gold standard of lymphedema care, including manual lymphatic drainage (MLD), skin care to limit infection risk and increase skin excursion, lymphatic pumping exercise, and during the Intensive Phase multilayer, gradient compression bandaging to reduce limb volume. Once limb volume reduction reaches a clinical plateau custom compression garments that provide appropriate fit, compression and containment are fitted. Throughout the treatment course Pt/ caregiver will learn lymphedema prevention strategies and precautions, learn to perform all LE self-care home program components. Without skilled OT for lymphedema care, Pt's condition will progress and further functional decline is expected.)    OBJECTIVE IMPAIRMENTS: Abnormal gait, decreased activity tolerance, decreased balance, decreased knowledge of condition, decreased knowledge of use of DME, decreased mobility, difficulty walking, decreased ROM, decreased strength, increased edema, impaired flexibility, impaired sensation, improper body mechanics, obesity, pain, and chronic , progressive, BLE swelling with related tissue changes and thickening, increased infection risk, non-healing wound .   ACTIVITY LIMITATIONS: Difficulty performing basic and instrumental ADLs (LB dressing, LB bathing,bathing, toileting, dressing, hygiene/grooming, and functional ambulation. Difficulty performing leisure pursuits and productive activities requiring standing and/ or walking for > 19 minutes  PARTICIPATION LIMITATIONS:  Impaired social participation, decreased participation in church  PERSONAL FACTORS: Behavior pattern, Fitness, Past/current experiences, Social background, Time since onset of injury/illness/exacerbation, and 3+ comorbidities: OA, hx R ankle fracture and non-healing wound, hx plantar fasciitis  are also affecting patient's functional outcome.   REHAB POTENTIAL: Good  CLINICAL DECISION MAKING: Evolving/moderate  complexity  EVALUATION COMPLEXITY: Moderate   GOALS: Goals reviewed with patient? Yes  SHORT TERM GOALS: Target date: 4th OT Rx visit   Pt will demonstrate understanding of lymphedema precautions and prevention strategies with modified independence using a printed reference to identify at least 5 precautions and discussing how s/he may implement them into daily life to reduce risk of progression with extra time. Baseline:Max A Goal status: GOAL MET  2.  Pt will be able to apply multilayer, knee length, gradient, compression wraps to one leg at a time from toes to below knee with modified independence (extra time) to decrease limb volume, to limit infection risk, and to limit lymphedema progression.  Baseline: Dependent Goal status: GOAL MET  LONG TERM GOALS: Target date: 02/18/24  Given this patient's Intake score of 50 % on the functional outcomes FOTO tool, patient will experience an increase in function of 5 points to improve basic and instrumental ADLs performance, including lymphedema self-care.  Baseline: 50 Goal status: INITIAL. 12/25/23 GOAL DEFERRED. FOTO tool discontinued.  2.  Given this patient's Intake score of 47.06 % on the Lymphedema Life Impact Scale (LLIS), patient will experience a reduction of at least 5 points in her perceived level of functional impairment resulting from lymphedema to improve functional performance and quality of life (QOL). Baseline: 47.06 % Goal status: PROGRESSING  3.  Pt will achieve at least a 10% volume reduction in B legs to return limb to typical size and shape, to limit infection risk and LE progression, to decrease pain, to improve function. Baseline: Dependent Goal status: Achieved w R LEG w/ overall reduction to date of 22.4%. 02/12/24 LLE decreased by 16.98%  date. GOAL MET  4.  Pt will obtain appropriate compression garments/devices and achieve modified independence (extra time + assistive devices) with donning/doffing to optimize limb  volume reductions and limit LE progression over time. Baseline: Dependent Goal status: PARTIALLY MET . Fitted RLE garments last session.   5. During Intensive phase CDT , with modified independence, Pt will achieve at least 85%  compliance with all lymphedema self-care home program components, including daily skin care, compression wraps and /or garments, simple self MLD and lymphatic pumping therex to habituate LE self care protocol  into ADLs for optimal LE self-management over time. Baseline: Dependent Goal status:PROGRESSING  PLAN:  OT FREQUENCY: 1x/week and PRN  OT DURATION: 12 weeks and PRN  PLANNED INTERVENTIONS: 97110-Therapeutic exercises, 97530- Therapeutic activity, 97140- Manual therapy, Dry Needling, Manual lymph drainage, and Compression bandaging Complete Decongestive Therapy: Manual lympathic drainage, skin care,    compression wraps,, then fit with appropriate compression garments during Self-management Phase.  PLAN FOR NEXT SESSION:  LLE MLD w simultaneous skin care Multilayer compression wraps Pt edu for lymphedema self-ca home program.  Loel Dubonnet, Hayley, OTR/L, CLT-LANA 02/14/24 9:30 AM

## 2024-02-14 ENCOUNTER — Ambulatory Visit: Admitting: Occupational Therapy

## 2024-02-14 ENCOUNTER — Encounter: Payer: Self-pay | Admitting: Occupational Therapy

## 2024-02-17 ENCOUNTER — Encounter: Payer: Self-pay | Admitting: Occupational Therapy

## 2024-02-17 ENCOUNTER — Ambulatory Visit: Payer: Medicare PPO | Admitting: Occupational Therapy

## 2024-02-17 DIAGNOSIS — I890 Lymphedema, not elsewhere classified: Secondary | ICD-10-CM | POA: Diagnosis not present

## 2024-02-17 NOTE — Therapy (Signed)
 OUTPATIENT OCCUPATIONAL THERAPY TREATMENT NOTE AND PROGRESS REPORT  BILATERAL LOWER EXTREMITY LYMPHEDEMA   Patient Name: Hayley Barr MRN: [id number] DOB:[birth year], 68 y.o., female Today's Date: 02/17/2024  REPORTING PERIOD: 01/03/24 - 02/17/24  END OF SESSION:   OT End of Session - 02/17/24 1314     Visit Number 20    Number of Visits 36    Date for OT Re-Evaluation 05/03/24    OT Start Time 0110    OT Stop Time 0210    OT Time Calculation (min) 60 min    Activity Tolerance Patient tolerated treatment well;No increased pain    Behavior During Therapy [facility] for tasks assessed/performed             History reviewed. No pertinent past medical history. History reviewed. No pertinent surgical history. Patient Active Problem List   Diagnosis Date Noted   Foot pain, right 05/15/2022   Benign essential hypertension 09/02/2020   Diabetes (HCC) 09/02/2020   Dyslipidemia, goal LDL below 100 09/02/2020   Esophageal reflux 09/02/2020   Irritable bowel syndrome without diarrhea 09/02/2020   Primary osteoarthritis of knees, bilateral 09/02/2020   Psoriasis 09/02/2020   Vitamin D deficiency 09/02/2020   Abscess and cellulitis 02/08/2015   Recurrent umbilical hernia 10/23/2014   Hair loss 09/10/2014   Ventral hernia 09/10/2014    PCP: Jacinto Reap, MD  REFERRING PROVIDER: Alvester Morin, MD  REFERRING DIAG: 189.0  THERAPY DIAG:  Lymphedema, not elsewhere classified  Rationale for Evaluation and Treatment: Rehabilitation  ONSET DATE: 1.5 yrs after an episode of shingles  SUBJECTIVE:                                                                                                                                                                                           SUBJECTIVE STATEMENT: Hayley Barr presents to Occupational Therapy for BLE lymphedema. Pt arrives seated in transport wc. Pt denies LE lymphedema-related pain today. Pt has no new complaints. Pt  in agreement with plan to complete LLE custom garment measurements today.  PERTINENT HISTORY: HTN, OA, DM type 2, pre-existing B leg and foot lymphedema 2/2 suspected venous insufficiency, R>L. R leg Fx with reconstruction of Achilles;  chronic plantar  fasciitis, Negative for DVT in 1923, Hx R foot abscess and cellulitis, IBS, Psoriasis, bilateral neuropathy feet and legs, currently has PT 2 x weekly   PAIN:  Are you having pain? no, L leg and knee leg pain not rated/10 Location: B legs, feet and ankles, R>L Description: heaviness, stiffness, tightness What makes it better? elevation What makes it worse? Standing, walking, dependent sitting  PRECAUTIONS: Fall Risk;  Infection Risk,  LYMPHEDEMA precautions, DM type 2 precautions  WEIGHT BEARING RESTRICTIONS: No  FALLS:  Has patient fallen in last 6 months? Yes. 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  COGNITION:  Overall cognitive status: Within functional limits for tasks assessed   OBSERVATIONS / OTHER ASSESSMENTS:  Mild-moderate, BLE lymphedema 2/2 suspected venous insufficiency and obesity ( weight induced lymphedema.  POSTURE: WFL  LE ROM: WFL for  clinical tasks  LE MMT: WFL for clinical task  LYMPHEDEMA ASSESSMENTS:   SURGERY TYPE/DATE: N/A, non cancer related  Hx INFECTIONS: NONE     Hx WOUNDS: currently has a dime sized non healing wound  at base of R heel incision from achilles reconstruction   FOTO functional outcome measure: Initial 09/29/23: 50%- 02/04/24: FOTO Discontinued  Lymphedema Life Impact Scale (LLIS) Initial 09/29/23: 47.06%  BLE COMPARATIVE LIMB VOLUMETRICS:   11/04/23 Initial  LANDMARK RIGHT   R LEG (A-D) 6686.8 ml  R THIGH (E-G) ml  R FULL LIMB (A-G) ml  Limb Volume differential (LVD)  0.5 %, L>R  Volume change since initial %  Volume change overall V  (Blank rows = not tested)  LANDMARK LEFT   L LEG (A-D) 6743.7 ml  L THIGH (E-G) ml  L FULL LIMB (A-G) ml  Limb Volume differential (LVD)  %  Volume change since initial %  Volume change overall %   (Blank rows = not tested)  6th visit RLE 12/10/23  LANDMARK RIGHT   R LEG (A-D) 5451.6 ml  R THIGH (E-G) ml  R FULL LIMB (A-G) ml  Limb Volume differential (LVD)  %  Volume change since initial  DEC 18.47% since 11/04/23  Volume change overall V  (Blank rows = not tested)   9th visit RLE 12/24/23  [facility] RIGHT   R LEG (A-D) 5451.6 ml  R THIGH (E-G) ml  R FULL LIMB (A-G) ml  Limb Volume differential (LVD)  %  Volume change since initial DECREASED 22.4% overall  Volume change since 12/10/23 DECREASED 4.8%  (Blank rows = not tested)  LLE COMPARATIVE LIMB VOLUMETRICS:   02/12/24 19 th visit ( last measured LE at initial Rx session on 11/04/23)   LANDMARK LEFT   L LEG (A-D) 5598.5 ml  L THIGH (E-G) ml  L FULL LIMB (A-G) ml  Limb Volume differential (LVD)   7.8%, L>R%  Volume change since initial  DECREASED 16.98%%  Volume change overall %   (Blank rows = not tested)  GAIT: Distance walked: arrived in transport wc. Able to transfer to Rx bed and back to transport wc using Single point cane (modified independence)  TODAY'S TREATMENT:  Anatomical measurements for custom, ccl 2, flat knit, Jobst ELVAREX  knee high, ccl 1 Jobst ELVAREX PLUS, seamless) toe cap, and L LEG Jobst RELAX custom HOS device.  Pt/ family education re  LE self-care Multilayer compression wraps  from toes to below knee on L  PATIENT EDUCATION:  Continued Pt/ CG edu for lymphedema self care home program throughout session. Topics include outcome of comparative limb volumetrics- starting limb volume differentials (LVDs), technology and gradient techniques used for short stretch, multilayer compression wrapping, simple self-MLD, therapeutic lymphatic pumping exercises, skin/nail care, LE precautions,. compression garment recommendations and specifications, wear and care schedule and compression garment donning / doffing w assistive devices. Discussed progress towards all OT goals since commencing CDT. All questions answered to the Pt's satisfaction. Good return. Person educated: Patient and family Education method: Explanation, Demonstration, and Handouts Education comprehension: verbalized understanding, returned demonstration, verbal cues required, and needs further education  HOME EXERCISE PROGRAM: BLE lymphatic pumping there ex using- 1 set of 10 reps, each exercise in order-  1-2 x daily, bilaterally Simple self MLD 1 x daily Daily skin care and inspection to increase hydration, skin mobility and decrease infection risk- can be done during MLD Compression wraps 23/7 until garment fitting complete (multilayer compression wraps to R LEG using 1 each of 8, 10, and 12 cm wide short stretch compression wraps over single layer of stockinett and Rosidal Soft foam ( 0.4 cm thick) . Finished wraps with stretch net to keep tape in place. )  ASSESSMENT: CLINICAL IMPRESSION: Pt demonstrates excellent progress towards all OT goals for lymphedema management. LLE comparative limb volumetrics reveal excellent response to OT and CDT. L LEG is decreased by 16.98% since initially measured, and LVD is changed from 0.5%, L>R initially, to 7.8%,  L>R. Pt is R dominant. Skin condition is very good. Hypersensitivity to RLE touch is nearly resolved. Pt is able to fit preferred street shoes, and she is nearly modified independent with all lymphedema self-care home program components. Completed initial anatomical measurements for  LLE custom, ccl 2 ( 23-32 mmHg) flat knit, Jobst ELVAREX knee high,  LLE ccl 1 ( 18-21 mmHg) Jobst ELVAREX PLUS, seamless toe cap,  and L LEG Jobst RELAX custom HOS device needed to limit fibrosis formation and to facilitate increased lymphatic flow during HOS/  Custom-made gradient compression garments and HOS devices are medically necessary because they are uniquely sized and shaped to fit the exact dimensions of the affected extremities, and to provide appropriate medical grade, graduated compression essential for optimally managing chronic, progressive lymphedema. Multiple custom compression garments are needed to ensure proper hygiene to limit infection risk. Custom compression garments should be replaced q 3-6 months When worn consistently for optimal lipo-lymphedema self-management over time. HOS devices, medically necessary to limit fibrosis buildup in tissue, should be replaced q 2 years and PRN when worn out.       Cont OT frequency recently decreased to 1 x weekly to conserve visits and finances.  Cont as per POC.   (10/29/23 INITIAL EVALUATION:  Tarita Erb is a 68 yo female presenting with mild, stage 2, BLE lymphedema, R>L.  Longstanding, mild, B ankle swelling is exacerbated for the past 1.5 years s/P an episode of shingles affecting the R leg, and a subsequent fall resulting in ankle fracture with Achilles reconstruction and ongoing, non-healing wound at the distal end of surgical scar. This situation is concerning re elevated infection risk from lymphatic dysfunction in the context of DM type II,  and ongoing functional decline due to associated problems with ambulation and functional  mobility.  Lymphedema occurs secondary to orthopedic trauma when the injury damages lymphatic vessels and structures in the area, leading to a buildup of fluid carrying cellular debris, waste products, bacteria, viruses, and damaged or abnormal cells in the tissues causing protein-rich swelling in the affected body part. This secondary lymphedema, often arising from significant soft tissue trauma accompanying the fracture, can potentially delay wound healing and complicate fracture management.   Progressing BLE lymphedema limits Hayley Grondin's functional performance in all occupational domains, including functional ambulation and transfers, standing tolerance, dependent sitting for more than 15 minutes,  basic and instrumental ADLs performance, including lower body dressing, LB bathing, fitting preferred street shoes and LB clothing; driving, shopping, and home management . Lymphedema and associated pain limits Pt's ability to perform productive activities and  leisure pursuits and to participate in socialization at home and in the community.  BLE lymphedema contributes to elevated infection risk and increased falls risk due to body asymmetry. Hayley Arwood will benefit from skilled OT for Complete Decongestive Therapy (CDT) the gold standard of lymphedema care, including manual lymphatic drainage (MLD), skin care to limit infection risk and increase skin excursion, lymphatic pumping exercise, and during the Intensive Phase multilayer, gradient compression bandaging to reduce limb volume. Once limb volume reduction reaches a clinical plateau custom compression garments that provide appropriate fit, compression and containment are fitted. Throughout the treatment course Pt/ caregiver will learn lymphedema prevention strategies and precautions, learn to perform all LE self-care home program components. Without skilled OT for lymphedema care, Pt's condition will progress and further functional decline is  expected.)    OBJECTIVE IMPAIRMENTS: Abnormal gait, decreased activity tolerance, decreased balance, decreased knowledge of condition, decreased knowledge of use of DME, decreased mobility, difficulty walking, decreased ROM, decreased strength, increased edema, impaired flexibility, impaired sensation, improper body mechanics, obesity, pain, and chronic , progressive, BLE swelling with related tissue changes and thickening, increased infection risk, non-healing wound .   ACTIVITY LIMITATIONS: Difficulty performing basic and instrumental ADLs (LB dressing, LB bathing,bathing, toileting, dressing, hygiene/grooming, and functional ambulation. Difficulty performing leisure pursuits and productive activities requiring standing and/ or walking for > 19 minutes  PARTICIPATION LIMITATIONS:  Impaired social participation, decreased participation in church  PERSONAL FACTORS: Behavior pattern, Fitness, Past/current experiences, Social background, Time since onset of injury/illness/exacerbation, and 3+ comorbidities: OA, hx R ankle fracture and non-healing wound, hx plantar fasciitis  are also affecting patient's functional outcome.   REHAB POTENTIAL: Good  CLINICAL DECISION MAKING: Evolving/moderate complexity  EVALUATION COMPLEXITY: Moderate   GOALS: Goals reviewed with patient? Yes  SHORT TERM GOALS: Target date: 4th OT Rx visit   Pt will demonstrate understanding of lymphedema precautions and prevention strategies with modified independence using a printed reference to identify at least 5 precautions and discussing how s/he may implement them into daily life to reduce risk of progression with extra time. Baseline:Max A Goal status: GOAL MET  2.  Pt will be able to apply multilayer, knee length, gradient, compression wraps to one leg at a time from toes to below knee with modified independence (extra time) to decrease limb volume, to limit infection risk, and to limit lymphedema progression.   Baseline: Dependent Goal status: GOAL MET  LONG TERM GOALS: Target date: 02/18/24  Given this patient's Intake score of 50 % on the functional outcomes FOTO tool, patient will experience an increase in function of 5 points to improve basic and instrumental ADLs  performance, including lymphedema self-care.  Baseline: 50 Goal status: INITIAL. 12/25/23 GOAL DEFERRED. FOTO tool discontinued.  2.  Given this patient's Intake score of 47.06 % on the Lymphedema Life Impact Scale (LLIS), patient will experience a reduction of at least 5 points in her perceived level of functional impairment resulting from lymphedema to improve functional performance and quality of life (QOL). Baseline: 47.06 % Goal status: PROGRESSING  3.  Pt will achieve at least a 10% volume reduction in B legs to return limb to typical size and shape, to limit infection risk and LE progression, to decrease pain, to improve function. Baseline: Dependent Goal status: Achieved w R LEG w/ overall reduction to date of 22.4%. 02/12/24 LLE decreased by 16.98%  date. GOAL MET  4.  Pt will obtain appropriate compression garments/devices and achieve modified independence (extra time + assistive devices) with donning/doffing to optimize limb volume reductions and limit LE progression over time. Baseline: Dependent Goal status: PARTIALLY MET . Fitted RLE garments last session.   5. During Intensive phase CDT , with modified independence, Pt will achieve at least 85% compliance with all lymphedema self-care home program components, including daily skin care, compression wraps and /or garments, simple self MLD and lymphatic pumping therex to habituate LE self care protocol  into ADLs for optimal LE self-management over time. Baseline: Dependent Goal status:PROGRESSING  PLAN:  OT FREQUENCY: 1x/week and PRN  OT DURATION: 12 weeks and PRN  PLANNED INTERVENTIONS: 97110-Therapeutic exercises, 97530- Therapeutic activity, 97140- Manual therapy,  Dry Needling, Manual lymph drainage, and Compression bandaging Complete Decongestive Therapy: Manual lympathic drainage, skin care,    compression wraps,, then fit with appropriate compression garments during Self-management Phase.  PLAN FOR NEXT SESSION:  LLE MLD w simultaneous skin care Multilayer compression wraps Pt edu for lymphedema self-ca home program.  Loel Dubonnet, Hayley, OTR/L, CLT-LANA 02/17/24 3:41 PM

## 2024-02-17 NOTE — Therapy (Deleted)
 [facility] at [facility] [address] [place], [place], [address] Phone: [phone]   Fax:  [phone]  Patient Details  Name: Hayley Barr MRN: [id number] Date of Birth: 08-30-1956 Referring Provider:  Cleda Clarks  Encounter Date: 02/17/2024  Loel Dubonnet, MS, OTR/L, [facility] 02/17/24 3:40 PM   [place] [facility] [address] [place], [place], [address] Phone: [phone]   Fax:  [phone]

## 2024-02-19 ENCOUNTER — Ambulatory Visit: Payer: Medicare PPO | Admitting: Occupational Therapy

## 2024-02-19 DIAGNOSIS — I890 Lymphedema, not elsewhere classified: Secondary | ICD-10-CM | POA: Diagnosis not present

## 2024-02-19 NOTE — Therapy (Signed)
 OUTPATIENT OCCUPATIONAL THERAPY TREATMENT NOTE AND PROGRESS REPORT  BILATERAL LOWER EXTREMITY LYMPHEDEMA   Patient Name: Hayley Barr MRN: [id number] DOB:November 06, 1956, 68 y.o., female Today's Date: 02/19/2024  REPORTING PERIOD: 01/03/24 - 02/17/24  END OF SESSION:   OT End of Session - 02/19/24 1010     Visit Number 21    Number of Visits 36    Date for OT Re-Evaluation 05/03/24    OT Start Time 1000    OT Stop Time 1110    OT Time Calculation (min) 70 min    Activity Tolerance Patient tolerated treatment well;No increased pain    Behavior During Therapy [facility] for tasks assessed/performed             No past medical history on file. No past surgical history on file. Patient Active Problem List   Diagnosis Date Noted   Foot pain, right 05/15/2022   Benign essential hypertension 09/02/2020   Diabetes (HCC) 09/02/2020   Dyslipidemia, goal LDL below 100 09/02/2020   Esophageal reflux 09/02/2020   Irritable bowel syndrome without diarrhea 09/02/2020   Primary osteoarthritis of knees, bilateral 09/02/2020   Psoriasis 09/02/2020   Vitamin D deficiency 09/02/2020   Abscess and cellulitis 02/08/2015   Recurrent umbilical hernia 10/23/2014   Hair loss 09/10/2014   Ventral hernia 09/10/2014    PCP: Jacinto Reap, MD  REFERRING PROVIDER: Alvester Morin, MD  REFERRING DIAG: 189.0  THERAPY DIAG:  Lymphedema, not elsewhere classified  Rationale for Evaluation and Treatment: Rehabilitation  ONSET DATE: 1.5 yrs after an episode of shingles  SUBJECTIVE:                                                                                                                                                                                           SUBJECTIVE STATEMENT: [name] presents to Occupational Therapy for BLE lymphedema. Pt arrives seated in transport wc. Pt denies LE lymphedema-related pain today. Pt has no new complaints.  PERTINENT HISTORY: HTN, OA, DM type  2, pre-existing B leg and foot lymphedema 2/2 suspected venous insufficiency, R>L. R leg Fx with reconstruction of Achilles;  chronic plantar  fasciitis, Negative for DVT in 1923, Hx R foot abscess and cellulitis, IBS, Psoriasis, bilateral neuropathy feet and legs, currently has PT 2 x weekly   PAIN:  Are you having pain? no, L leg and knee leg pain not rated/10 Location: B legs, feet and ankles, R>L Description: heaviness, stiffness, tightness What makes it better? elevation What makes it worse? Standing, walking, dependent sitting  PRECAUTIONS: Fall Risk;  Infection Risk, LYMPHEDEMA precautions, DM type 2 precautions  WEIGHT BEARING RESTRICTIONS: No  FALLS:  Has patient fallen in last 6 months? Yes. 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  COGNITION:  Overall cognitive status: Within functional limits for tasks assessed   OBSERVATIONS / OTHER ASSESSMENTS:  Mild-moderate, BLE lymphedema 2/2 suspected venous insufficiency and obesity ( weight induced lymphedema.  POSTURE: WFL  LE ROM: WFL for  clinical tasks  LE MMT: WFL for clinical task  LYMPHEDEMA ASSESSMENTS:   SURGERY TYPE/DATE: N/A, non cancer related  Hx INFECTIONS: NONE     Hx WOUNDS: currently has a dime sized non healing wound at base of R heel incision from achilles reconstruction   FOTO functional outcome measure: Initial 09/29/23: 50%-  02/04/24: FOTO Discontinued  Lymphedema Life Impact Scale (LLIS) Initial 09/29/23: 47.06%  BLE COMPARATIVE LIMB VOLUMETRICS:   11/04/23 Initial  LANDMARK RIGHT   R LEG (A-D) 6686.8 ml  R THIGH (E-G) ml  R FULL LIMB (A-G) ml  Limb Volume differential (LVD)  0.5 %, L>R  Volume change since initial %  Volume change overall V  (Blank rows = not tested)  LANDMARK LEFT   L LEG (A-D) 6743.7 ml  L THIGH (E-G) ml  L FULL LIMB (A-G) ml  Limb Volume differential (LVD)  %  Volume change since initial %  Volume change overall %   (Blank rows = not tested)  6th visit RLE 12/10/23  LANDMARK RIGHT   R LEG (A-D) 5451.6 ml  R THIGH (E-G) ml  R FULL LIMB (A-G) ml  Limb Volume differential (LVD)  %  Volume change since initial  DEC 18.47% since 11/04/23  Volume change overall V  (Blank rows = not tested)   9th visit RLE 12/24/23  [facility] RIGHT   R LEG (A-D) 5451.6 ml  R THIGH (E-G) ml  R FULL LIMB (A-G) ml  Limb Volume differential (LVD)  %  Volume change since initial DECREASED 22.4% overall  Volume change since 12/10/23 DECREASED 4.8%  (Blank rows = not tested)  LLE COMPARATIVE LIMB VOLUMETRICS:   02/12/24 19 th visit ( last measured LE at initial Rx session on 11/04/23)   LANDMARK LEFT   L LEG (A-D) 5598.5 ml  L THIGH (E-G) ml  L FULL LIMB (A-G) ml  Limb Volume differential (LVD)   7.8%, L>R%  Volume change since initial  DECREASED 16.98%%  Volume change overall %   (Blank rows = not tested)  GAIT: Distance walked: arrived in transport wc. Able to transfer to Rx bed and back to transport wc using Single point cane (modified independence)  TODAY'S TREATMENT:  Anatomical measurements for custom, ccl 2, flat knit, Jobst ELVAREX knee high, ccl 1 Jobst ELVAREX PLUS, seamless) toe cap, and L LEG Jobst RELAX custom HOS device.  Pt/ family  education re  LE self-care Multilayer compression wraps  from toes to below knee on L  PATIENT EDUCATION:  Continued Pt/ CG edu for lymphedema self care home program throughout session. Topics include outcome of comparative limb volumetrics- starting limb volume differentials (LVDs), technology and gradient techniques used for short stretch, multilayer compression wrapping, simple self-MLD, therapeutic lymphatic pumping exercises, skin/nail care, LE precautions,. compression garment recommendations and specifications, wear and care schedule and compression garment donning / doffing w assistive devices. Discussed progress towards all OT goals since commencing CDT. All questions answered to the Pt's satisfaction. Good return. Person educated: Patient and family Education method: Explanation, Demonstration, and Handouts Education comprehension: verbalized understanding, returned demonstration, verbal cues required, and needs further education  HOME EXERCISE PROGRAM: BLE lymphatic pumping there ex using- 1 set of 10 reps, each exercise in order-  1-2 x daily, bilaterally Simple self MLD 1 x daily Daily skin care and inspection to increase hydration, skin mobility and decrease infection risk- can be done during MLD Compression wraps 23/7 until garment fitting complete (multilayer compression wraps to R LEG using 1 each of 8, 10, and 12 cm wide short stretch compression wraps over single layer of stockinett and Rosidal Soft foam ( 0.4 cm thick) . Finished wraps with stretch net to keep tape in place. )  ASSESSMENT: CLINICAL IMPRESSION: Pt tolerated LLE/LLQ MLD and multilayer compression wrapping without difficulty today. Limb volume reduction continues to decrease slowly. Skin condition cont to improve.  Cont OT frequency recently decreased to 1 x weekly to conserve visits and finances.  Cont as per POC.   (10/29/23 INITIAL EVALUATION:  Tinia Gullo is a 68 yo female presenting with mild, stage 2, BLE  lymphedema, R>L.  Longstanding, mild, B ankle swelling is exacerbated for the past 1.5 years s/P an episode of shingles affecting the R leg, and a subsequent fall resulting in ankle fracture with Achilles reconstruction and ongoing, non-healing wound at the distal end of surgical scar. This situation is concerning re elevated infection risk from lymphatic dysfunction in the context of DM type II,  and ongoing functional decline due to associated problems with ambulation and functional mobility.  Lymphedema occurs secondary to orthopedic trauma when the injury damages lymphatic vessels and structures in the area, leading to a buildup of fluid carrying cellular debris, waste products, bacteria, viruses, and damaged or abnormal cells in the tissues causing protein-rich swelling in the affected body part. This secondary lymphedema, often arising from significant soft tissue trauma accompanying the fracture, can potentially delay wound healing and complicate fracture management.   Progressing BLE lymphedema limits Ms Goehring's functional performance in all occupational domains, including functional ambulation and transfers, standing tolerance, dependent sitting for more than 15 minutes,  basic and instrumental ADLs performance, including lower body dressing, LB bathing, fitting preferred street shoes and LB clothing; driving, shopping, and home management . Lymphedema and associated pain limits Pt's ability to perform productive activities and  leisure pursuits and to participate in socialization at home and in the community.  BLE lymphedema contributes to elevated infection risk and increased falls risk due to body asymmetry. Ms Knightly will benefit from skilled OT for Complete Decongestive Therapy (CDT) the gold standard of lymphedema care, including manual lymphatic drainage (MLD), skin care to limit infection risk and increase skin excursion,  lymphatic pumping exercise, and during the Intensive Phase  multilayer, gradient compression bandaging to reduce limb volume. Once limb volume reduction reaches a clinical plateau custom compression garments that provide appropriate fit, compression and containment are fitted. Throughout the treatment course Pt/ caregiver will learn lymphedema prevention strategies and precautions, learn to perform all LE self-care home program components. Without skilled OT for lymphedema care, Pt's condition will progress and further functional decline is expected.)    OBJECTIVE IMPAIRMENTS: Abnormal gait, decreased activity tolerance, decreased balance, decreased knowledge of condition, decreased knowledge of use of DME, decreased mobility, difficulty walking, decreased ROM, decreased strength, increased edema, impaired flexibility, impaired sensation, improper body mechanics, obesity, pain, and chronic , progressive, BLE swelling with related tissue changes and thickening, increased infection risk, non-healing wound .   ACTIVITY LIMITATIONS: Difficulty performing basic and instrumental ADLs (LB dressing, LB bathing,bathing, toileting, dressing, hygiene/grooming, and functional ambulation. Difficulty performing leisure pursuits and productive activities requiring standing and/ or walking for > 19 minutes  PARTICIPATION LIMITATIONS:  Impaired social participation, decreased participation in church  PERSONAL FACTORS: Behavior pattern, Fitness, Past/current experiences, Social background, Time since onset of injury/illness/exacerbation, and 3+ comorbidities: OA, hx R ankle fracture and non-healing wound, hx plantar fasciitis  are also affecting patient's functional outcome.   REHAB POTENTIAL: Good  CLINICAL DECISION MAKING: Evolving/moderate complexity  EVALUATION COMPLEXITY: Moderate   GOALS: Goals reviewed with patient? Yes  SHORT TERM GOALS: Target date: 4th OT Rx visit   Pt will demonstrate understanding of lymphedema precautions and prevention strategies with  modified independence using a printed reference to identify at least 5 precautions and discussing how s/he may implement them into daily life to reduce risk of progression with extra time. Baseline:Max A Goal status: GOAL MET  2.  Pt will be able to apply multilayer, knee length, gradient, compression wraps to one leg at a time from toes to below knee with modified independence (extra time) to decrease limb volume, to limit infection risk, and to limit lymphedema progression.  Baseline: Dependent Goal status: GOAL MET  LONG TERM GOALS: Target date: 02/18/24  Given this patient's Intake score of 50 % on the functional outcomes FOTO tool, patient will experience an increase in function of 5 points to improve basic and instrumental ADLs performance, including lymphedema self-care.  Baseline: 50 Goal status: INITIAL. 12/25/23 GOAL DEFERRED. FOTO tool discontinued.  2.  Given this patient's Intake score of 47.06 % on the Lymphedema Life Impact Scale (LLIS), patient will experience a reduction of at least 5 points in her perceived level of functional impairment resulting from lymphedema to improve functional performance and quality of life (QOL). Baseline: 47.06 % Goal status: PROGRESSING  3.  Pt will achieve at least a 10% volume reduction in B legs to return limb to typical size and shape, to limit infection risk and LE progression, to decrease pain, to improve function. Baseline: Dependent Goal status: Achieved w R LEG w/ overall reduction to date of 22.4%. 02/12/24 LLE decreased by 16.98%  date. GOAL MET  4.  Pt will obtain appropriate compression garments/devices and achieve modified independence (extra time + assistive devices) with donning/doffing to optimize limb volume reductions and limit LE progression over time. Baseline: Dependent Goal status: PARTIALLY MET . Fitted RLE garments last session.   5. During Intensive phase CDT , with modified independence, Pt will achieve at least 85%  compliance with all lymphedema self-care home program components, including daily skin care, compression wraps and /or garments, simple self MLD and  lymphatic pumping therex to habituate LE self care protocol  into ADLs for optimal LE self-management over time. Baseline: Dependent Goal status:PROGRESSING  PLAN:  OT FREQUENCY: 1x/week and PRN  OT DURATION: 12 weeks and PRN  PLANNED INTERVENTIONS: 97110-Therapeutic exercises, 97530- Therapeutic activity, 97140- Manual therapy, Dry Needling, Manual lymph drainage, and Compression bandaging Complete Decongestive Therapy: Manual lympathic drainage, skin care,    compression wraps,, then fit with appropriate compression garments during Self-management Phase.  PLAN FOR NEXT SESSION:  LLE MLD w simultaneous skin care Multilayer compression wraps Pt edu for lymphedema self-ca home program.  Loel Dubonnet, MS, OTR/L, CLT-LANA 02/19/24 11:10 AM

## 2024-02-24 ENCOUNTER — Ambulatory Visit: Payer: Medicare PPO | Admitting: Occupational Therapy

## 2024-02-24 ENCOUNTER — Encounter: Payer: Self-pay | Admitting: Occupational Therapy

## 2024-02-24 DIAGNOSIS — I890 Lymphedema, not elsewhere classified: Secondary | ICD-10-CM

## 2024-02-24 NOTE — Therapy (Signed)
 OUTPATIENT OCCUPATIONAL THERAPY TREATMENT NOTE   BILATERAL LOWER EXTREMITY LYMPHEDEMA   Patient Name: Hayley Barr MRN: [id number] DOB:[birth year], 68 y.o., female Today's Date: 02/24/2024  REPORTING PERIOD: 01/03/24 - 02/17/24  END OF SESSION:   OT End of Session - 02/24/24 1427     Visit Number 22    Number of Visits 36    Date for OT Re-Evaluation 05/03/24    OT Start Time 0100    OT Stop Time 0200    OT Time Calculation (min) 60 min    Activity Tolerance Patient tolerated treatment well;No increased pain    Behavior During Therapy [facility] for tasks assessed/performed             History reviewed. No pertinent past medical history. History reviewed. No pertinent surgical history. Patient Active Problem List   Diagnosis Date Noted   Foot pain, right 05/15/2022   Benign essential hypertension 09/02/2020   Diabetes (HCC) 09/02/2020   Dyslipidemia, goal LDL below 100 09/02/2020   Esophageal reflux 09/02/2020   Irritable bowel syndrome without diarrhea 09/02/2020   Primary osteoarthritis of knees, bilateral 09/02/2020   Psoriasis 09/02/2020   Vitamin D deficiency 09/02/2020   Abscess and cellulitis 02/08/2015   Recurrent umbilical hernia 10/23/2014   Hair loss 09/10/2014   Ventral hernia 09/10/2014    PCP: Jacinto Reap, MD  REFERRING PROVIDER: Alvester Morin, MD  REFERRING DIAG: 189.0  THERAPY DIAG:  Lymphedema, not elsewhere classified  Rationale for Evaluation and Treatment: Rehabilitation  ONSET DATE: 1.5 yrs after an episode of shingles  SUBJECTIVE:                                                                                                                                                                                           SUBJECTIVE STATEMENT: Hayley Barr presents to Occupational Therapy for BLE lymphedema. Pt arrives seated in transport wc. Pt denies LE lymphedema-related pain today. Pt has no new complaints.  PERTINENT HISTORY:  HTN, OA, DM type 2, pre-existing B leg and foot lymphedema 2/2 suspected venous insufficiency, R>L. R leg Fx with reconstruction of Achilles;  chronic plantar  fasciitis, Negative for DVT in 1923, Hx R foot abscess and cellulitis, IBS, Psoriasis, bilateral neuropathy feet and legs, currently has PT 2 x weekly   PAIN:  Are you having pain? no, L leg and knee leg pain not rated/10 Location: B legs, feet and ankles, R>L Description: heaviness, stiffness, tightness What makes it better? elevation What makes it worse? Standing, walking, dependent sitting  PRECAUTIONS: Fall Risk;  Infection Risk, LYMPHEDEMA precautions, DM type 2 precautions  WEIGHT BEARING RESTRICTIONS: No  FALLS:  Has patient fallen in last 6 months? Yes. 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  COGNITION:  Overall cognitive status: Within functional limits for tasks assessed   OBSERVATIONS / OTHER ASSESSMENTS:  Mild-moderate, BLE lymphedema 2/2 suspected venous insufficiency and obesity ( weight induced lymphedema.  POSTURE: WFL  LE ROM: WFL for  clinical tasks  LE MMT: WFL for clinical task  LYMPHEDEMA ASSESSMENTS:   SURGERY TYPE/DATE: N/A, non cancer related  Hx INFECTIONS: NONE     Hx WOUNDS: currently has a dime sized non healing wound at base of R heel incision from achilles reconstruction   FOTO functional outcome measure:  Initial 09/29/23: 50%- 02/04/24: FOTO Discontinued  Lymphedema Life Impact Scale (LLIS) Initial 09/29/23: 47.06%  BLE COMPARATIVE LIMB VOLUMETRICS:   11/04/23 Initial  LANDMARK RIGHT   R LEG (A-D) 6686.8 ml  R THIGH (E-G) ml  R FULL LIMB (A-G) ml  Limb Volume differential (LVD)  0.5 %, L>R  Volume change since initial %  Volume change overall V  (Blank rows = not tested)  LANDMARK LEFT   L LEG (A-D) 6743.7 ml  L THIGH (E-G) ml  L FULL LIMB (A-G) ml  Limb Volume differential (LVD)  %  Volume change since initial %  Volume change overall %   (Blank rows = not tested)  6th visit RLE 12/10/23  LANDMARK RIGHT   R LEG (A-D) 5451.6 ml  R THIGH (E-G) ml  R FULL LIMB (A-G) ml  Limb Volume differential (LVD)  %  Volume change since initial  DEC 18.47% since 11/04/23  Volume change overall V  (Blank rows = not tested)   9th visit RLE 12/24/23  [facility] RIGHT   R LEG (A-D) 5451.6 ml  R THIGH (E-G) ml  R FULL LIMB (A-G) ml  Limb Volume differential (LVD)  %  Volume change since initial DECREASED 22.4% overall  Volume change since 12/10/23 DECREASED 4.8%  (Blank rows = not tested)  LLE COMPARATIVE LIMB VOLUMETRICS:   02/12/24 19 th visit ( last measured LE at initial Rx session on 11/04/23)   LANDMARK LEFT   L LEG (A-D) 5598.5 ml  L THIGH (E-G) ml  L FULL LIMB (A-G) ml  Limb Volume differential (LVD)   7.8%, L>R%  Volume change since initial  DECREASED 16.98%%  Volume change overall %   (Blank rows = not tested)  GAIT: Distance walked: arrived in transport wc. Able to transfer to Rx bed and back to transport wc using Single point cane (modified independence)  TODAY'S TREATMENT:  LLE/LLQ MLD with simultaneous skin care . Also performed fibrosis techniques to medial and lateral malleolus  Pt education re  LE self-care Multilayer compression  wraps  from toes to below knee on L  PATIENT EDUCATION:  Continued Pt/ CG edu for lymphedema self care home program throughout session. Topics include outcome of comparative limb volumetrics- starting limb volume differentials (LVDs), technology and gradient techniques used for short stretch, multilayer compression wrapping, simple self-MLD, therapeutic lymphatic pumping exercises, skin/nail care, LE precautions,. compression garment recommendations and specifications, wear and care schedule and compression garment donning / doffing w assistive devices. Discussed progress towards all OT goals since commencing CDT. All questions answered to the Pt's satisfaction. Good return. Person educated: Patient and family Education method: Explanation, Demonstration, and Handouts Education comprehension: verbalized understanding, returned demonstration, verbal cues required, and needs further education  HOME EXERCISE PROGRAM: BLE lymphatic pumping there ex using- 1 set of 10 reps, each exercise in order-  1-2 x daily, bilaterally Simple self MLD 1 x daily Daily skin care and inspection to increase hydration, skin mobility and decrease infection risk- can be done during MLD Compression wraps 23/7 until garment fitting complete (multilayer compression wraps to R LEG using 1 each of 8, 10, and 12 cm wide short stretch compression wraps over single layer of stockinett and Rosidal Soft foam ( 0.4 cm thick) . Finished wraps with stretch net to keep tape in place. )  ASSESSMENT: CLINICAL IMPRESSION: Pt tolerated LLE/LLQ MLD and multilayer compression wrapping without difficulty today. Limb volume reduction continues to decrease slowly. Skin condition cont to improve.  Cont OT frequency recently decreased to 1 x weekly to conserve visits and finances.  Cont as per POC.   (10/29/23 INITIAL EVALUATION:  Satin Barr is a 68 yo female presenting with mild, stage 2, BLE lymphedema, R>L.  Longstanding, mild, B ankle  swelling is exacerbated for the past 1.5 years s/P an episode of shingles affecting the R leg, and a subsequent fall resulting in ankle fracture with Achilles reconstruction and ongoing, non-healing wound at the distal end of surgical scar. This situation is concerning re elevated infection risk from lymphatic dysfunction in the context of DM type II,  and ongoing functional decline due to associated problems with ambulation and functional mobility.  Lymphedema occurs secondary to orthopedic trauma when the injury damages lymphatic vessels and structures in the area, leading to a buildup of fluid carrying cellular debris, waste products, bacteria, viruses, and damaged or abnormal cells in the tissues causing protein-rich swelling in the affected body part. This secondary lymphedema, often arising from significant soft tissue trauma accompanying the fracture, can potentially delay wound healing and complicate fracture management.   Progressing BLE lymphedema limits Hayley Barr's functional performance in all occupational domains, including functional ambulation and transfers, standing tolerance, dependent sitting for more than 15 minutes,  basic and instrumental ADLs performance, including lower body dressing, LB bathing, fitting preferred street shoes and LB clothing; driving, shopping, and home management . Lymphedema and associated pain limits Pt's ability to perform productive activities and  leisure pursuits and to participate in socialization at home and in the community.  BLE lymphedema contributes to elevated infection risk and increased falls risk due to body asymmetry. Hayley Barr will benefit from skilled OT for Complete Decongestive Therapy (CDT) the gold standard of lymphedema care, including manual lymphatic drainage (MLD), skin care to limit infection risk and increase skin excursion, lymphatic pumping exercise, and during the Intensive Phase multilayer, gradient compression bandaging to reduce  limb volume. Once limb volume reduction reaches a clinical plateau custom compression garments that provide  appropriate fit, compression and containment are fitted. Throughout the treatment course Pt/ caregiver will learn lymphedema prevention strategies and precautions, learn to perform all LE self-care home program components. Without skilled OT for lymphedema care, Pt's condition will progress and further functional decline is expected.)    OBJECTIVE IMPAIRMENTS: Abnormal gait, decreased activity tolerance, decreased balance, decreased knowledge of condition, decreased knowledge of use of DME, decreased mobility, difficulty walking, decreased ROM, decreased strength, increased edema, impaired flexibility, impaired sensation, improper body mechanics, obesity, pain, and chronic , progressive, BLE swelling with related tissue changes and thickening, increased infection risk, non-healing wound .   ACTIVITY LIMITATIONS: Difficulty performing basic and instrumental ADLs (LB dressing, LB bathing,bathing, toileting, dressing, hygiene/grooming, and functional ambulation. Difficulty performing leisure pursuits and productive activities requiring standing and/ or walking for > 19 minutes  PARTICIPATION LIMITATIONS:  Impaired social participation, decreased participation in church  PERSONAL FACTORS: Behavior pattern, Fitness, Past/current experiences, Social background, Time since onset of injury/illness/exacerbation, and 3+ comorbidities: OA, hx R ankle fracture and non-healing wound, hx plantar fasciitis  are also affecting patient's functional outcome.   REHAB POTENTIAL: Good  CLINICAL DECISION MAKING: Evolving/moderate complexity  EVALUATION COMPLEXITY: Moderate   GOALS: Goals reviewed with patient? Yes  SHORT TERM GOALS: Target date: 4th OT Rx visit   Pt will demonstrate understanding of lymphedema precautions and prevention strategies with modified independence using a printed reference to  identify at least 5 precautions and discussing how s/he may implement them into daily life to reduce risk of progression with extra time. Baseline:Max A Goal status: GOAL MET  2.  Pt will be able to apply multilayer, knee length, gradient, compression wraps to one leg at a time from toes to below knee with modified independence (extra time) to decrease limb volume, to limit infection risk, and to limit lymphedema progression.  Baseline: Dependent Goal status: GOAL MET  LONG TERM GOALS: Target date: 02/18/24  Given this patient's Intake score of 50 % on the functional outcomes FOTO tool, patient will experience an increase in function of 5 points to improve basic and instrumental ADLs performance, including lymphedema self-care.  Baseline: 50 Goal status: INITIAL. 12/25/23 GOAL DEFERRED. FOTO tool discontinued.  2.  Given this patient's Intake score of 47.06 % on the Lymphedema Life Impact Scale (LLIS), patient will experience a reduction of at least 5 points in her perceived level of functional impairment resulting from lymphedema to improve functional performance and quality of life (QOL). Baseline: 47.06 % Goal status: PROGRESSING  3.  Pt will achieve at least a 10% volume reduction in B legs to return limb to typical size and shape, to limit infection risk and LE progression, to decrease pain, to improve function. Baseline: Dependent Goal status: Achieved w R LEG w/ overall reduction to date of 22.4%. 02/12/24 LLE decreased by 16.98%  date. GOAL MET  4.  Pt will obtain appropriate compression garments/devices and achieve modified independence (extra time + assistive devices) with donning/doffing to optimize limb volume reductions and limit LE progression over time. Baseline: Dependent Goal status: PARTIALLY MET . Fitted RLE garments last session.   5. During Intensive phase CDT , with modified independence, Pt will achieve at least 85% compliance with all lymphedema self-care home program  components, including daily skin care, compression wraps and /or garments, simple self MLD and lymphatic pumping therex to habituate LE self care protocol  into ADLs for optimal LE self-management over time. Baseline: Dependent Goal status:PROGRESSING  PLAN:  OT FREQUENCY: 1 x/week and  PRN  OT DURATION: 12 weeks and PRN  PLANNED INTERVENTIONS: 97110-Therapeutic exercises, 97530- Therapeutic activity, 97140- Manual therapy, Dry Needling, Manual lymph drainage, and Compression bandaging Complete Decongestive Therapy: Manual lympathic drainage, skin care,    compression wraps,, then fit with appropriate compression garments during Self-management Phase.  PLAN FOR NEXT SESSION:  LLE MLD w simultaneous skin care Multilayer compression wraps Pt edu for lymphedema self-ca home program.  Loel Dubonnet, Hayley, OTR/L, CLT-LANA 02/24/24 2:29 PM

## 2024-02-28 ENCOUNTER — Ambulatory Visit: Payer: Medicare PPO | Attending: Cardiothoracic Surgery | Admitting: Occupational Therapy

## 2024-02-28 DIAGNOSIS — I890 Lymphedema, not elsewhere classified: Secondary | ICD-10-CM | POA: Diagnosis present

## 2024-02-28 NOTE — Therapy (Signed)
 OUTPATIENT OCCUPATIONAL THERAPY TREATMENT NOTE   BILATERAL LOWER EXTREMITY LYMPHEDEMA   Patient Name: Hayley Barr MRN: [id number] DOB:[birth year], 68 y.o., female Today's Date: 02/28/2024  REPORTING PERIOD: 01/03/24 - 02/17/24  END OF SESSION:   OT End of Session - 02/28/24 1109     Visit Number 23    Number of Visits 36    Date for OT Re-Evaluation 05/03/24    OT Start Time 1006    OT Stop Time 1106    OT Time Calculation (min) 60 min    Activity Tolerance Patient tolerated treatment well;No increased pain    Behavior During Therapy [facility] for tasks assessed/performed             No past medical history on file. No past surgical history on file. Patient Active Problem List   Diagnosis Date Noted   Foot pain, right 05/15/2022   Benign essential hypertension 09/02/2020   Diabetes (HCC) 09/02/2020   Dyslipidemia, goal LDL below 100 09/02/2020   Esophageal reflux 09/02/2020   Irritable bowel syndrome without diarrhea 09/02/2020   Primary osteoarthritis of knees, bilateral 09/02/2020   Psoriasis 09/02/2020   Vitamin D deficiency 09/02/2020   Abscess and cellulitis 02/08/2015   Recurrent umbilical hernia 10/23/2014   Hair loss 09/10/2014   Ventral hernia 09/10/2014    PCP: Jacinto Reap, MD  REFERRING PROVIDER: Alvester Morin, MD  REFERRING DIAG: 189.0  THERAPY DIAG:  Lymphedema, not elsewhere classified  Rationale for Evaluation and Treatment: Rehabilitation  ONSET DATE: 1.5 yrs after an episode of shingles  SUBJECTIVE:                                                                                                                                                                                           SUBJECTIVE STATEMENT: Hayley Barr presents to Occupational Therapy for BLE lymphedema. Pt arrives seated in transport wc. Pt denies LE lymphedema-related pain today. Pt has no new complaints.  PERTINENT HISTORY: HTN, OA, DM type 2, pre-existing B  leg and foot lymphedema 2/2 suspected venous insufficiency, R>L. R leg Fx with reconstruction of Achilles;  chronic plantar  fasciitis, Negative for DVT in 1923, Hx R foot abscess and cellulitis, IBS, Psoriasis, bilateral neuropathy feet and legs, currently has PT 2 x weekly   PAIN:  Are you having pain? no, L leg and knee leg pain not rated/10 Location: B legs, feet and ankles, R>L Description: heaviness, stiffness, tightness What makes it better? elevation What makes it worse? Standing, walking, dependent sitting  PRECAUTIONS: Fall Risk;  Infection Risk, LYMPHEDEMA precautions, DM type 2 precautions  WEIGHT BEARING RESTRICTIONS: No  FALLS:  Has  patient fallen in last 6 months? Yes. 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  COGNITION:  Overall cognitive status: Within functional limits for tasks assessed   OBSERVATIONS / OTHER ASSESSMENTS:  Mild-moderate, BLE lymphedema 2/2 suspected venous insufficiency and obesity ( weight induced lymphedema.  POSTURE: WFL  LE ROM: WFL for  clinical tasks  LE MMT: WFL for clinical task  LYMPHEDEMA ASSESSMENTS:   SURGERY TYPE/DATE: N/A, non cancer related  Hx INFECTIONS: NONE     Hx WOUNDS: currently has a dime sized non healing wound at base of R heel incision from achilles reconstruction   FOTO functional outcome measure: Initial 09/29/23: 50%- 02/04/24: FOTO  Discontinued  Lymphedema Life Impact Scale (LLIS) Initial 09/29/23: 47.06%  BLE COMPARATIVE LIMB VOLUMETRICS:   11/04/23 Initial  LANDMARK RIGHT   R LEG (A-D) 6686.8 ml  R THIGH (E-G) ml  R FULL LIMB (A-G) ml  Limb Volume differential (LVD)  0.5 %, L>R  Volume change since initial %  Volume change overall V  (Blank rows = not tested)  LANDMARK LEFT   L LEG (A-D) 6743.7 ml  L THIGH (E-G) ml  L FULL LIMB (A-G) ml  Limb Volume differential (LVD)  %  Volume change since initial %  Volume change overall %   (Blank rows = not tested)  6th visit RLE 12/10/23  LANDMARK RIGHT   R LEG (A-D) 5451.6 ml  R THIGH (E-G) ml  R FULL LIMB (A-G) ml  Limb Volume differential (LVD)  %  Volume change since initial  DEC 18.47% since 11/04/23  Volume change overall V  (Blank rows = not tested)   9th visit RLE 12/24/23  [facility] RIGHT   R LEG (A-D) 5451.6 ml  R THIGH (E-G) ml  R FULL LIMB (A-G) ml  Limb Volume differential (LVD)  %  Volume change since initial DECREASED 22.4% overall  Volume change since 12/10/23 DECREASED 4.8%  (Blank rows = not tested)  LLE COMPARATIVE LIMB VOLUMETRICS:   02/12/24 19 th visit ( last measured LE at initial Rx session on 11/04/23)   LANDMARK LEFT   L LEG (A-D) 5598.5 ml  L THIGH (E-G) ml  L FULL LIMB (A-G) ml  Limb Volume differential (LVD)   7.8%, L>R%  Volume change since initial  DECREASED 16.98%%  Volume change overall %   (Blank rows = not tested)  GAIT: Distance walked: arrived in transport wc. Able to transfer to Rx bed and back to transport wc using Single point cane (modified independence)  TODAY'S TREATMENT:  LLE/LLQ MLD with simultaneous skin care . Also performed fibrosis techniques to medial and lateral malleolus  Pt education re  LE self-care-ongoing Multilayer compression wraps  from toes to below  knee on L  PATIENT EDUCATION:  Continued Pt/ CG edu for lymphedema self care home program throughout session. Topics include outcome of comparative limb volumetrics- starting limb volume differentials (LVDs), technology and gradient techniques used for short stretch, multilayer compression wrapping, simple self-MLD, therapeutic lymphatic pumping exercises, skin/nail care, LE precautions,. compression garment recommendations and specifications, wear and care schedule and compression garment donning / doffing w assistive devices. Discussed progress towards all OT goals since commencing CDT. All questions answered to the Pt's satisfaction. Good return. Person educated: Patient and family Education method: Explanation, Demonstration, and Handouts Education comprehension: verbalized understanding, returned demonstration, verbal cues required, and needs further education  HOME EXERCISE PROGRAM: BLE lymphatic pumping there ex using- 1 set of 10 reps, each exercise in order-  1-2 x daily, bilaterally Simple self MLD 1 x daily Daily skin care and inspection to increase hydration, skin mobility and decrease infection risk- can be done during MLD Compression wraps 23/7 until garment fitting complete (multilayer compression wraps to R LEG using 1 each of 8, 10, and 12 cm wide short stretch compression wraps over single layer of stockinett and Rosidal Soft foam ( 0.4 cm thick) . Finished wraps with stretch net to keep tape in place. )  ASSESSMENT: CLINICAL IMPRESSION: Pt tolerated LLE/LLQ MLD and multilayer compression wrapping without difficulty today. Limb volume reduction continues to decrease slowly. Skin condition cont to improve.  Cont OT frequency recently decreased to 1 x weekly to conserve visits and finances.  Fit LLE custom garments and devices as soon as delivered, then transition to self-management mode of CDT.  Cont as per POC.   (10/29/23 INITIAL EVALUATION:  Xianna Wesely is a 68 yo female  presenting with mild, stage 2, BLE lymphedema, R>L.  Longstanding, mild, B ankle swelling is exacerbated for the past 1.5 years s/P an episode of shingles affecting the R leg, and a subsequent fall resulting in ankle fracture with Achilles reconstruction and ongoing, non-healing wound at the distal end of surgical scar. This situation is concerning re elevated infection risk from lymphatic dysfunction in the context of DM type II,  and ongoing functional decline due to associated problems with ambulation and functional mobility.  Lymphedema occurs secondary to orthopedic trauma when the injury damages lymphatic vessels and structures in the area, leading to a buildup of fluid carrying cellular debris, waste products, bacteria, viruses, and damaged or abnormal cells in the tissues causing protein-rich swelling in the affected body part. This secondary lymphedema, often arising from significant soft tissue trauma accompanying the fracture, can potentially delay wound healing and complicate fracture management.   Progressing BLE lymphedema limits Hayley Beckstrand's functional performance in all occupational domains, including functional ambulation and transfers, standing tolerance, dependent sitting for more than 15 minutes,  basic and instrumental ADLs performance, including lower body dressing, LB bathing, fitting preferred street shoes and LB clothing; driving, shopping, and home management . Lymphedema and associated pain limits Pt's ability to perform productive activities and  leisure pursuits and to participate in socialization at home and in the community.  BLE lymphedema contributes to elevated infection risk and increased falls risk due to body asymmetry. Hayley Kathman will benefit from skilled OT for Complete Decongestive Therapy (CDT) the gold standard of lymphedema care, including manual lymphatic drainage (MLD), skin care to limit infection risk and increase skin excursion, lymphatic pumping exercise,  and during the Intensive Phase multilayer, gradient compression  bandaging to reduce limb volume. Once limb volume reduction reaches a clinical plateau custom compression garments that provide appropriate fit, compression and containment are fitted. Throughout the treatment course Pt/ caregiver will learn lymphedema prevention strategies and precautions, learn to perform all LE self-care home program components. Without skilled OT for lymphedema care, Pt's condition will progress and further functional decline is expected.)    OBJECTIVE IMPAIRMENTS: Abnormal gait, decreased activity tolerance, decreased balance, decreased knowledge of condition, decreased knowledge of use of DME, decreased mobility, difficulty walking, decreased ROM, decreased strength, increased edema, impaired flexibility, impaired sensation, improper body mechanics, obesity, pain, and chronic , progressive, BLE swelling with related tissue changes and thickening, increased infection risk, non-healing wound .   ACTIVITY LIMITATIONS: Difficulty performing basic and instrumental ADLs (LB dressing, LB bathing,bathing, toileting, dressing, hygiene/grooming, and functional ambulation. Difficulty performing leisure pursuits and productive activities requiring standing and/ or walking for > 19 minutes  PARTICIPATION LIMITATIONS:  Impaired social participation, decreased participation in church  PERSONAL FACTORS: Behavior pattern, Fitness, Past/current experiences, Social background, Time since onset of injury/illness/exacerbation, and 3+ comorbidities: OA, hx R ankle fracture and non-healing wound, hx plantar fasciitis  are also affecting patient's functional outcome.   REHAB POTENTIAL: Good  CLINICAL DECISION MAKING: Evolving/moderate complexity  EVALUATION COMPLEXITY: Moderate   GOALS: Goals reviewed with patient? Yes  SHORT TERM GOALS: Target date: 4th OT Rx visit   Pt will demonstrate understanding of lymphedema precautions  and prevention strategies with modified independence using a printed reference to identify at least 5 precautions and discussing how s/he may implement them into daily life to reduce risk of progression with extra time. Baseline:Max A Goal status: GOAL MET  2.  Pt will be able to apply multilayer, knee length, gradient, compression wraps to one leg at a time from toes to below knee with modified independence (extra time) to decrease limb volume, to limit infection risk, and to limit lymphedema progression.  Baseline: Dependent Goal status: GOAL MET  LONG TERM GOALS: Target date: 02/18/24  Given this patient's Intake score of 50 % on the functional outcomes FOTO tool, patient will experience an increase in function of 5 points to improve basic and instrumental ADLs performance, including lymphedema self-care.  Baseline: 50 Goal status: INITIAL. 12/25/23 GOAL DEFERRED. FOTO tool discontinued.  2.  Given this patient's Intake score of 47.06 % on the Lymphedema Life Impact Scale (LLIS), patient will experience a reduction of at least 5 points in her perceived level of functional impairment resulting from lymphedema to improve functional performance and quality of life (QOL). Baseline: 47.06 % Goal status: PROGRESSING  3.  Pt will achieve at least a 10% volume reduction in B legs to return limb to typical size and shape, to limit infection risk and LE progression, to decrease pain, to improve function. Baseline: Dependent Goal status: Achieved w R LEG w/ overall reduction to date of 22.4%. 02/12/24 LLE decreased by 16.98%  date. GOAL MET  4.  Pt will obtain appropriate compression garments/devices and achieve modified independence (extra time + assistive devices) with donning/doffing to optimize limb volume reductions and limit LE progression over time. Baseline: Dependent Goal status: PARTIALLY MET . Fitted RLE garments last session.   5. During Intensive phase CDT , with modified independence, Pt  will achieve at least 85% compliance with all lymphedema self-care home program components, including daily skin care, compression wraps and /or garments, simple self MLD and lymphatic pumping therex to habituate LE self care protocol  into ADLs  for optimal LE self-management over time. Baseline: Dependent Goal status:PROGRESSING  PLAN:  OT FREQUENCY: 1 x/week and PRN  OT DURATION: 12 weeks and PRN  PLANNED INTERVENTIONS: 97110-Therapeutic exercises, 97530- Therapeutic activity, 97140- Manual therapy, Dry Needling, Manual lymph drainage, and Compression bandaging Complete Decongestive Therapy: Manual lympathic drainage, skin care,    compression wraps,, then fit with appropriate compression garments during Self-management Phase.  PLAN FOR NEXT SESSION:  LLE MLD w simultaneous skin care Multilayer compression wraps Pt edu for lymphedema self-ca home program.  Loel Dubonnet, Hayley, OTR/L, CLT-LANA 02/28/24 11:10 AM

## 2024-03-02 ENCOUNTER — Ambulatory Visit: Admitting: Occupational Therapy

## 2024-03-02 ENCOUNTER — Ambulatory Visit: Payer: Medicare PPO | Admitting: Occupational Therapy

## 2024-03-02 DIAGNOSIS — I890 Lymphedema, not elsewhere classified: Secondary | ICD-10-CM

## 2024-03-02 NOTE — Therapy (Unsigned)
 OUTPATIENT OCCUPATIONAL THERAPY TREATMENT NOTE   BILATERAL LOWER EXTREMITY LYMPHEDEMA   Patient Name: Hayley Barr MRN: [id number] DOB:1956/07/08, 68 y.o., female Today's Date: 03/03/2024  REPORTING PERIOD: 01/03/24 - 02/17/24  END OF SESSION:   OT End of Session - 03/02/24 1304     Visit Number 24    Number of Visits 36    Date for OT Re-Evaluation 05/03/24    OT Start Time 0105    OT Stop Time 0210    OT Time Calculation (min) 65 min    Activity Tolerance Patient tolerated treatment well;No increased pain    Behavior During Therapy [facility] for tasks assessed/performed             No past medical history on file. No past surgical history on file. Patient Active Problem List   Diagnosis Date Noted   Foot pain, right 05/15/2022   Benign essential hypertension 09/02/2020   Diabetes (HCC) 09/02/2020   Dyslipidemia, goal LDL below 100 09/02/2020   Esophageal reflux 09/02/2020   Irritable bowel syndrome without diarrhea 09/02/2020   Primary osteoarthritis of knees, bilateral 09/02/2020   Psoriasis 09/02/2020   Vitamin D deficiency 09/02/2020   Abscess and cellulitis 02/08/2015   Recurrent umbilical hernia 10/23/2014   Hair loss 09/10/2014   Ventral hernia 09/10/2014    PCP: Jacinto Reap, MD  REFERRING PROVIDER: Alvester Morin, MD  REFERRING DIAG: 189.0  THERAPY DIAG:  Lymphedema, not elsewhere classified  Rationale for Evaluation and Treatment: Rehabilitation  ONSET DATE: 1.5 yrs after an episode of shingles  SUBJECTIVE:                                                                                                                                                                                           SUBJECTIVE STATEMENT: Hayley Barr presents to Occupational Therapy for BLE lymphedema. Pt arrives seated in transport wc. Pt denies LE lymphedema-related pain today. Pt has no new complaints.  PERTINENT HISTORY: HTN, OA, DM type 2, pre-existing B  leg and foot lymphedema 2/2 suspected venous insufficiency, R>L. R leg Fx with reconstruction of Achilles;  chronic plantar  fasciitis, Negative for DVT in 1923, Hx R foot abscess and cellulitis, IBS, Psoriasis, bilateral neuropathy feet and legs, currently has PT 2 x weekly   PAIN:  Are you having pain? no, L leg and knee leg pain not rated/10 Location: B legs, feet and ankles, R>L Description: heaviness, stiffness, tightness What makes it better? elevation What makes it worse? Standing, walking, dependent sitting  PRECAUTIONS: Fall Risk;  Infection Risk, LYMPHEDEMA precautions, DM type 2 precautions  WEIGHT BEARING RESTRICTIONS: No  FALLS:  Has  patient fallen in last 6 months? Yes. 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  COGNITION:  Overall cognitive status: Within functional limits for tasks assessed   OBSERVATIONS / OTHER ASSESSMENTS:  Mild-moderate, BLE lymphedema 2/2 suspected venous insufficiency and obesity ( weight induced lymphedema.  POSTURE: WFL  LE ROM: WFL for  clinical tasks  LE MMT: WFL for clinical task  LYMPHEDEMA ASSESSMENTS:   SURGERY TYPE/DATE: N/A, non cancer related  Hx INFECTIONS: NONE     Hx WOUNDS: currently has a dime sized non healing wound at base of R heel incision from achilles reconstruction   FOTO functional outcome measure: Initial 09/29/23: 50%- 02/04/24: FOTO  Discontinued  Lymphedema Life Impact Scale (LLIS) Initial 09/29/23: 47.06%  BLE COMPARATIVE LIMB VOLUMETRICS:   11/04/23 Initial  LANDMARK RIGHT   R LEG (A-D) 6686.8 ml  R THIGH (E-G) ml  R FULL LIMB (A-G) ml  Limb Volume differential (LVD)  0.5 %, L>R  Volume change since initial %  Volume change overall V  (Blank rows = not tested)  LANDMARK LEFT   L LEG (A-D) 6743.7 ml  L THIGH (E-G) ml  L FULL LIMB (A-G) ml  Limb Volume differential (LVD)  %  Volume change since initial %  Volume change overall %   (Blank rows = not tested)  6th visit RLE 12/10/23  LANDMARK RIGHT   R LEG (A-D) 5451.6 ml  R THIGH (E-G) ml  R FULL LIMB (A-G) ml  Limb Volume differential (LVD)  %  Volume change since initial  DEC 18.47% since 11/04/23  Volume change overall V  (Blank rows = not tested)   9th visit RLE 12/24/23  [facility] RIGHT   R LEG (A-D) 5451.6 ml  R THIGH (E-G) ml  R FULL LIMB (A-G) ml  Limb Volume differential (LVD)  %  Volume change since initial DECREASED 22.4% overall  Volume change since 12/10/23 DECREASED 4.8%  (Blank rows = not tested)  LLE COMPARATIVE LIMB VOLUMETRICS:   02/12/24 19 th visit ( last measured LE at initial Rx session on 11/04/23)   LANDMARK LEFT   L LEG (A-D) 5598.5 ml  L THIGH (E-G) ml  L FULL LIMB (A-G) ml  Limb Volume differential (LVD)   7.8%, L>R%  Volume change since initial  DECREASED 16.98%%  Volume change overall %   (Blank rows = not tested)  GAIT: Distance walked: arrived in transport wc. Able to transfer to Rx bed and back to transport wc using Single point cane (modified independence)  TODAY'S TREATMENT:  LLE/LLQ MLD with simultaneous skin care . Also performed fibrosis techniques to medial and lateral malleolus  Pt education re  LE self-care-ongoing Multilayer compression wraps  from toes to below  knee on L  PATIENT EDUCATION:  Continued Pt/ CG edu for lymphedema self care home program throughout session. Topics include outcome of comparative limb volumetrics- starting limb volume differentials (LVDs), technology and gradient techniques used for short stretch, multilayer compression wrapping, simple self-MLD, therapeutic lymphatic pumping exercises, skin/nail care, LE precautions,. compression garment recommendations and specifications, wear and care schedule and compression garment donning / doffing w assistive devices. Discussed progress towards all OT goals since commencing CDT. All questions answered to the Pt's satisfaction. Good return. Person educated: Patient and family Education method: Explanation, Demonstration, and Handouts Education comprehension: verbalized understanding, returned demonstration, verbal cues required, and needs further education  HOME EXERCISE PROGRAM: BLE lymphatic pumping there ex using- 1 set of 10 reps, each exercise in order-  1-2 x daily, bilaterally Simple self MLD 1 x daily Daily skin care and inspection to increase hydration, skin mobility and decrease infection risk- can be done during MLD Compression wraps 23/7 until garment fitting complete (multilayer compression wraps to R LEG using 1 each of 8, 10, and 12 cm wide short stretch compression wraps over single layer of stockinett and Rosidal Soft foam ( 0.4 cm thick) . Finished wraps with stretch net to keep tape in place. )  ASSESSMENT: CLINICAL IMPRESSION: Pt tolerated LLE/LLQ MLD and multilayer compression wrapping without difficulty today. Limb volume reduction continues to decrease slowly. Skin condition cont to improve.  Cont OT frequency recently decreased to 1 x weekly to conserve visits and finances.  Fit LLE custom garments and devices as soon as delivered, then transition to self-management mode of CDT.  Cont as per POC.   (10/29/23 INITIAL EVALUATION:  Jorden Fuerte is a 68 yo female  presenting with mild, stage 2, BLE lymphedema, R>L.  Longstanding, mild, B ankle swelling is exacerbated for the past 1.5 years s/P an episode of shingles affecting the R leg, and a subsequent fall resulting in ankle fracture with Achilles reconstruction and ongoing, non-healing wound at the distal end of surgical scar. This situation is concerning re elevated infection risk from lymphatic dysfunction in the context of DM type II,  and ongoing functional decline due to associated problems with ambulation and functional mobility.  Lymphedema occurs secondary to orthopedic trauma when the injury damages lymphatic vessels and structures in the area, leading to a buildup of fluid carrying cellular debris, waste products, bacteria, viruses, and damaged or abnormal cells in the tissues causing protein-rich swelling in the affected body part. This secondary lymphedema, often arising from significant soft tissue trauma accompanying the fracture, can potentially delay wound healing and complicate fracture management.   Progressing BLE lymphedema limits Hayley Cullins's functional performance in all occupational domains, including functional ambulation and transfers, standing tolerance, dependent sitting for more than 15 minutes,  basic and instrumental ADLs performance, including lower body dressing, LB bathing, fitting preferred street shoes and LB clothing; driving, shopping, and home management . Lymphedema and associated pain limits Pt's ability to perform productive activities and  leisure pursuits and to participate in socialization at home and in the community.  BLE lymphedema contributes to elevated infection risk and increased falls risk due to body asymmetry. Hayley Petros will benefit from skilled OT for Complete Decongestive Therapy (CDT) the gold standard of lymphedema care, including manual lymphatic drainage (MLD), skin care to limit infection risk and increase skin excursion, lymphatic pumping exercise,  and during the Intensive Phase multilayer, gradient compression  bandaging to reduce limb volume. Once limb volume reduction reaches a clinical plateau custom compression garments that provide appropriate fit, compression and containment are fitted. Throughout the treatment course Pt/ caregiver will learn lymphedema prevention strategies and precautions, learn to perform all LE self-care home program components. Without skilled OT for lymphedema care, Pt's condition will progress and further functional decline is expected.)    OBJECTIVE IMPAIRMENTS: Abnormal gait, decreased activity tolerance, decreased balance, decreased knowledge of condition, decreased knowledge of use of DME, decreased mobility, difficulty walking, decreased ROM, decreased strength, increased edema, impaired flexibility, impaired sensation, improper body mechanics, obesity, pain, and chronic , progressive, BLE swelling with related tissue changes and thickening, increased infection risk, non-healing wound .   ACTIVITY LIMITATIONS: Difficulty performing basic and instrumental ADLs (LB dressing, LB bathing,bathing, toileting, dressing, hygiene/grooming, and functional ambulation. Difficulty performing leisure pursuits and productive activities requiring standing and/ or walking for > 19 minutes  PARTICIPATION LIMITATIONS:  Impaired social participation, decreased participation in church  PERSONAL FACTORS: Behavior pattern, Fitness, Past/current experiences, Social background, Time since onset of injury/illness/exacerbation, and 3+ comorbidities: OA, hx R ankle fracture and non-healing wound, hx plantar fasciitis  are also affecting patient's functional outcome.   REHAB POTENTIAL: Good  CLINICAL DECISION MAKING: Evolving/moderate complexity  EVALUATION COMPLEXITY: Moderate   GOALS: Goals reviewed with patient? Yes  SHORT TERM GOALS: Target date: 4th OT Rx visit   Pt will demonstrate understanding of lymphedema precautions  and prevention strategies with modified independence using a printed reference to identify at least 5 precautions and discussing how s/he may implement them into daily life to reduce risk of progression with extra time. Baseline:Max A Goal status: GOAL MET  2.  Pt will be able to apply multilayer, knee length, gradient, compression wraps to one leg at a time from toes to below knee with modified independence (extra time) to decrease limb volume, to limit infection risk, and to limit lymphedema progression.  Baseline: Dependent Goal status: GOAL MET  LONG TERM GOALS: Target date: 02/18/24  Given this patient's Intake score of 50 % on the functional outcomes FOTO tool, patient will experience an increase in function of 5 points to improve basic and instrumental ADLs performance, including lymphedema self-care.  Baseline: 50 Goal status: INITIAL. 12/25/23 GOAL DEFERRED. FOTO tool discontinued.  2.  Given this patient's Intake score of 47.06 % on the Lymphedema Life Impact Scale (LLIS), patient will experience a reduction of at least 5 points in her perceived level of functional impairment resulting from lymphedema to improve functional performance and quality of life (QOL). Baseline: 47.06 % Goal status: PROGRESSING  3.  Pt will achieve at least a 10% volume reduction in B legs to return limb to typical size and shape, to limit infection risk and LE progression, to decrease pain, to improve function. Baseline: Dependent Goal status: Achieved w R LEG w/ overall reduction to date of 22.4%. 02/12/24 LLE decreased by 16.98%  date. GOAL MET  4.  Pt will obtain appropriate compression garments/devices and achieve modified independence (extra time + assistive devices) with donning/doffing to optimize limb volume reductions and limit LE progression over time. Baseline: Dependent Goal status: PARTIALLY MET . Fitted RLE garments last session.   5. During Intensive phase CDT , with modified independence, Pt  will achieve at least 85% compliance with all lymphedema self-care home program components, including daily skin care, compression wraps and /or garments, simple self MLD and lymphatic pumping therex to habituate LE self care protocol  into ADLs  for optimal LE self-management over time. Baseline: Dependent Goal status:PROGRESSING  PLAN:  OT FREQUENCY: 1 x/week and PRN  OT DURATION: 12 weeks and PRN  PLANNED INTERVENTIONS: 97110-Therapeutic exercises, 97530- Therapeutic activity, 97140- Manual therapy, Dry Needling, Manual lymph drainage, and Compression bandaging Complete Decongestive Therapy: Manual lympathic drainage, skin care,    compression wraps,, then fit with appropriate compression garments during Self-management Phase.  PLAN FOR NEXT SESSION:  LLE MLD w simultaneous skin care Multilayer compression wraps Pt edu for lymphedema self-ca home program.  Loel Dubonnet, Hayley, OTR/L, CLT-LANA 03/03/24 8:00 AM

## 2024-03-06 ENCOUNTER — Encounter: Payer: Self-pay | Admitting: Occupational Therapy

## 2024-03-06 ENCOUNTER — Ambulatory Visit: Payer: Medicare PPO | Admitting: Occupational Therapy

## 2024-03-06 DIAGNOSIS — I890 Lymphedema, not elsewhere classified: Secondary | ICD-10-CM | POA: Diagnosis not present

## 2024-03-06 NOTE — Therapy (Signed)
 OUTPATIENT OCCUPATIONAL THERAPY TREATMENT NOTE   BILATERAL LOWER EXTREMITY LYMPHEDEMA   Patient Name: Hayley Barr MRN: [id number] DOB:05-24-1956, 68 y.o., female Today's Date: 03/06/2024  REPORTING PERIOD: 01/03/24 - 02/17/24  END OF SESSION:   OT End of Session - 03/06/24 1010     Visit Number 25    Number of Visits 36    Date for OT Re-Evaluation 05/03/24    OT Start Time 1005    OT Stop Time 1102    OT Time Calculation (min) 57 min    Activity Tolerance Patient tolerated treatment well;No increased pain    Behavior During Therapy [facility] for tasks assessed/performed             History reviewed. No pertinent past medical history. History reviewed. No pertinent surgical history. Patient Active Problem List   Diagnosis Date Noted   Foot pain, right 05/15/2022   Benign essential hypertension 09/02/2020   Diabetes (HCC) 09/02/2020   Dyslipidemia, goal LDL below 100 09/02/2020   Esophageal reflux 09/02/2020   Irritable bowel syndrome without diarrhea 09/02/2020   Primary osteoarthritis of knees, bilateral 09/02/2020   Psoriasis 09/02/2020   Vitamin D deficiency 09/02/2020   Abscess and cellulitis 02/08/2015   Recurrent umbilical hernia 10/23/2014   Hair loss 09/10/2014   Ventral hernia 09/10/2014    PCP: Jacinto Reap, MD  REFERRING PROVIDER: Alvester Morin, MD  REFERRING DIAG: 189.0  THERAPY DIAG:  Lymphedema, not elsewhere classified  Rationale for Evaluation and Treatment: Rehabilitation  ONSET DATE: 1.5 yrs after an episode of shingles  SUBJECTIVE:                                                                                                                                                                                           SUBJECTIVE STATEMENT: [name] presents to Occupational Therapy for BLE lymphedema. Pt arrives seated in transport wc. Pt denies LE lymphedema-related pain today. Pt has no new complaints today. Pt iis looking  forward to receiving her RLE compression garments.  PERTINENT HISTORY: HTN, OA, DM type 2, pre-existing B leg and foot lymphedema 2/2 suspected venous insufficiency, R>L. R leg Fx with reconstruction of Achilles;  chronic plantar  fasciitis, Negative for DVT in 1923, Hx R foot abscess and cellulitis, IBS, Psoriasis, bilateral neuropathy feet and legs, currently has PT 2 x weekly   PAIN:  Are you having pain? no, L leg and knee leg pain not rated/10 Location: B legs, feet and ankles, R>L Description: heaviness, stiffness, tightness What makes it better? elevation What makes it worse? Standing, walking, dependent sitting  PRECAUTIONS: Fall Risk;  Infection Risk, LYMPHEDEMA precautions, DM  type 2 precautions  WEIGHT BEARING RESTRICTIONS: No  FALLS:  Has patient fallen in last 6 months? Yes. 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  COGNITION:  Overall cognitive status: Within functional limits for tasks assessed   OBSERVATIONS / OTHER ASSESSMENTS:  Mild-moderate, BLE lymphedema 2/2 suspected venous insufficiency and obesity ( weight induced lymphedema.  POSTURE: WFL  LE ROM: WFL for  clinical tasks  LE MMT: WFL for clinical task  LYMPHEDEMA ASSESSMENTS:   SURGERY TYPE/DATE: N/A, non cancer related  Hx INFECTIONS: NONE     Hx WOUNDS: currently has a dime sized non healing wound at base of R heel  incision from achilles reconstruction   FOTO functional outcome measure: Initial 09/29/23: 50%- 02/04/24: FOTO Discontinued  Lymphedema Life Impact Scale (LLIS) Initial 09/29/23: 47.06%  BLE COMPARATIVE LIMB VOLUMETRICS:   11/04/23 Initial  LANDMARK RIGHT   R LEG (A-D) 6686.8 ml  R THIGH (E-G) ml  R FULL LIMB (A-G) ml  Limb Volume differential (LVD)  0.5 %, L>R  Volume change since initial %  Volume change overall V  (Blank rows = not tested)  LANDMARK LEFT   L LEG (A-D) 6743.7 ml  L THIGH (E-G) ml  L FULL LIMB (A-G) ml  Limb Volume differential (LVD)  %  Volume change since initial %  Volume change overall %   (Blank rows = not tested)  6th visit RLE 12/10/23  LANDMARK RIGHT   R LEG (A-D) 5451.6 ml  R THIGH (E-G) ml  R FULL LIMB (A-G) ml  Limb Volume differential (LVD)  %  Volume change since initial  DEC 18.47% since 11/04/23  Volume change overall V  (Blank rows = not tested)   9th visit RLE 12/24/23  [facility] RIGHT   R LEG (A-D) 5451.6 ml  R THIGH (E-G) ml  R FULL LIMB (A-G) ml  Limb Volume differential (LVD)  %  Volume change since initial DECREASED 22.4% overall  Volume change since 12/10/23 DECREASED 4.8%  (Blank rows = not tested)  LLE COMPARATIVE LIMB VOLUMETRICS:   02/12/24 19 th visit ( last measured LE at initial Rx session on 11/04/23)   LANDMARK LEFT   L LEG (A-D) 5598.5 ml  L THIGH (E-G) ml  L FULL LIMB (A-G) ml  Limb Volume differential (LVD)   7.8%, L>R%  Volume change since initial  DECREASED 16.98%%  Volume change overall %   (Blank rows = not tested)  GAIT: Distance walked: arrived in transport wc. Able to transfer to Rx bed and back to transport wc using Single point cane (modified independence)  TODAY'S TREATMENT:  LLE/LLQ MLD with simultaneous skin care . Also performed fibrosis techniques to medial and lateral malleolus  Pt education re  LE self-care-ongoing Multilayer compression wraps  from toes to below knee on L  PATIENT EDUCATION:  Continued Pt/ CG edu for lymphedema self care home program throughout session. Topics include outcome of comparative limb volumetrics- starting limb volume differentials (LVDs), technology and gradient techniques used for short stretch, multilayer compression wrapping, simple self-MLD, therapeutic lymphatic pumping exercises, skin/nail care, LE precautions,. compression garment recommendations and specifications, wear and care schedule and compression garment donning / doffing w assistive devices. Discussed progress towards all OT goals since commencing CDT. All questions answered to the Pt's satisfaction. Good return. Person educated: Patient and family Education method: Explanation, Demonstration, and Handouts Education comprehension: verbalized understanding, returned demonstration, verbal cues required, and needs further education  HOME EXERCISE PROGRAM: BLE lymphatic pumping there ex using- 1 set of 10 reps, each exercise in order-  1-2 x daily, bilaterally Simple self MLD 1 x daily Daily skin care and inspection to increase hydration, skin mobility and decrease infection risk- can be done during MLD Compression wraps 23/7 until garment fitting complete (multilayer compression wraps to R LEG using 1 each of 8, 10, and 12 cm wide short stretch compression wraps over single layer of stockinett and Rosidal Soft foam ( 0.4 cm thick) . Finished wraps with stretch net to keep tape in place. )  ASSESSMENT: CLINICAL IMPRESSION: Pt tolerated LLE/LLQ MLD and multilayer compression wrapping without difficulty today. Limb volume reduction continues to decrease slowly. Skin condition cont to improve.  Cont OT frequency recently decreased to 1 x weekly to conserve visits and finances.  Fit LLE custom garments and devices as soon as delivered, then transition to  self-management mode of CDT.  Cont as per POC.   (10/29/23 INITIAL EVALUATION:  Elizabella Bucknam is a 68 yo female presenting with mild, stage 2, BLE lymphedema, R>L.  Longstanding, mild, B ankle swelling is exacerbated for the past 1.5 years s/P an episode of shingles affecting the R leg, and a subsequent fall resulting in ankle fracture with Achilles reconstruction and ongoing, non-healing wound at the distal end of surgical scar. This situation is concerning re elevated infection risk from lymphatic dysfunction in the context of DM type II,  and ongoing functional decline due to associated problems with ambulation and functional mobility.  Lymphedema occurs secondary to orthopedic trauma when the injury damages lymphatic vessels and structures in the area, leading to a buildup of fluid carrying cellular debris, waste products, bacteria, viruses, and damaged or abnormal cells in the tissues causing protein-rich swelling in the affected body part. This secondary lymphedema, often arising from significant soft tissue trauma accompanying the fracture, can potentially delay wound healing and complicate fracture management.   Progressing BLE lymphedema limits Ms [place]'s functional performance in all occupational domains, including functional ambulation and transfers, standing tolerance, dependent sitting for more than 15 minutes,  basic and instrumental ADLs performance, including lower body dressing, LB bathing, fitting preferred street shoes and LB clothing; driving, shopping, and home management . Lymphedema and associated pain limits Pt's ability to perform productive activities and  leisure pursuits and to participate in socialization at home and in the community.  BLE lymphedema contributes to elevated infection risk and increased falls risk due to body asymmetry. Ms Monsivais will benefit from skilled OT for Complete Decongestive Therapy (CDT) the gold standard of lymphedema care, including manual  lymphatic drainage (MLD), skin care to limit infection risk and increase skin excursion, lymphatic pumping exercise, and during the Intensive Phase multilayer, gradient compression bandaging  to reduce limb volume. Once limb volume reduction reaches a clinical plateau custom compression garments that provide appropriate fit, compression and containment are fitted. Throughout the treatment course Pt/ caregiver will learn lymphedema prevention strategies and precautions, learn to perform all LE self-care home program components. Without skilled OT for lymphedema care, Pt's condition will progress and further functional decline is expected.)    OBJECTIVE IMPAIRMENTS: Abnormal gait, decreased activity tolerance, decreased balance, decreased knowledge of condition, decreased knowledge of use of DME, decreased mobility, difficulty walking, decreased ROM, decreased strength, increased edema, impaired flexibility, impaired sensation, improper body mechanics, obesity, pain, and chronic , progressive, BLE swelling with related tissue changes and thickening, increased infection risk, non-healing wound .   ACTIVITY LIMITATIONS: Difficulty performing basic and instrumental ADLs (LB dressing, LB bathing,bathing, toileting, dressing, hygiene/grooming, and functional ambulation. Difficulty performing leisure pursuits and productive activities requiring standing and/ or walking for > 19 minutes  PARTICIPATION LIMITATIONS:  Impaired social participation, decreased participation in church  PERSONAL FACTORS: Behavior pattern, Fitness, Past/current experiences, Social background, Time since onset of injury/illness/exacerbation, and 3+ comorbidities: OA, hx R ankle fracture and non-healing wound, hx plantar fasciitis  are also affecting patient's functional outcome.   REHAB POTENTIAL: Good  CLINICAL DECISION MAKING: Evolving/moderate complexity  EVALUATION COMPLEXITY: Moderate   GOALS: Goals reviewed with patient?  Yes  SHORT TERM GOALS: Target date: 4th OT Rx visit   Pt will demonstrate understanding of lymphedema precautions and prevention strategies with modified independence using a printed reference to identify at least 5 precautions and discussing how s/he may implement them into daily life to reduce risk of progression with extra time. Baseline:Max A Goal status: GOAL MET  2.  Pt will be able to apply multilayer, knee length, gradient, compression wraps to one leg at a time from toes to below knee with modified independence (extra time) to decrease limb volume, to limit infection risk, and to limit lymphedema progression.  Baseline: Dependent Goal status: GOAL MET  LONG TERM GOALS: Target date: 02/18/24  Given this patient's Intake score of 50 % on the functional outcomes FOTO tool, patient will experience an increase in function of 5 points to improve basic and instrumental ADLs performance, including lymphedema self-care.  Baseline: 50 Goal status: INITIAL. 12/25/23 GOAL DEFERRED. FOTO tool discontinued.  2.  Given this patient's Intake score of 47.06 % on the Lymphedema Life Impact Scale (LLIS), patient will experience a reduction of at least 5 points in her perceived level of functional impairment resulting from lymphedema to improve functional performance and quality of life (QOL). Baseline: 47.06 % Goal status: PROGRESSING  3.  Pt will achieve at least a 10% volume reduction in B legs to return limb to typical size and shape, to limit infection risk and LE progression, to decrease pain, to improve function. Baseline: Dependent Goal status: Achieved w R LEG w/ overall reduction to date of 22.4%. 02/12/24 LLE decreased by 16.98%  date. GOAL MET  4.  Pt will obtain appropriate compression garments/devices and achieve modified independence (extra time + assistive devices) with donning/doffing to optimize limb volume reductions and limit LE progression over time. Baseline: Dependent Goal  status: PARTIALLY MET . Fitted RLE garments last session.   5. During Intensive phase CDT , with modified independence, Pt will achieve at least 85% compliance with all lymphedema self-care home program components, including daily skin care, compression wraps and /or garments, simple self MLD and lymphatic pumping therex to habituate LE self care protocol  into ADLs for  optimal LE self-management over time. Baseline: Dependent Goal status:PROGRESSING  PLAN:  OT FREQUENCY: 1 x/week and PRN  OT DURATION: 12 weeks and PRN  PLANNED INTERVENTIONS: 97110-Therapeutic exercises, 97530- Therapeutic activity, 97140- Manual therapy, Dry Needling, Manual lymph drainage, and Compression bandaging Complete Decongestive Therapy: Manual lympathic drainage, skin care,    compression wraps,, then fit with appropriate compression garments during Self-management Phase.  PLAN FOR NEXT SESSION:  LLE MLD w simultaneous skin care Multilayer compression wraps Pt edu for lymphedema self-ca home program.  Loel Dubonnet, MS, OTR/L, CLT-LANA 03/06/24 11:05 AM

## 2024-03-09 ENCOUNTER — Ambulatory Visit: Payer: Medicare PPO | Admitting: Occupational Therapy

## 2024-03-09 DIAGNOSIS — I890 Lymphedema, not elsewhere classified: Secondary | ICD-10-CM | POA: Diagnosis not present

## 2024-03-09 NOTE — Therapy (Signed)
 OUTPATIENT OCCUPATIONAL THERAPY TREATMENT NOTE   BILATERAL LOWER EXTREMITY LYMPHEDEMA   Patient Name: Hayley Barr MRN: [id number] DOB:October 11, 1956, 68 y.o., female Today's Date: 03/09/2024  REPORTING PERIOD: 01/03/24 - 02/17/24  END OF SESSION:   OT End of Session - 03/09/24 1407     Visit Number 26    Number of Visits 36    Date for OT Re-Evaluation 05/03/24    OT Start Time 0205    OT Stop Time 0305    OT Time Calculation (min) 60 min    Activity Tolerance Patient tolerated treatment well;No increased pain    Behavior During Therapy [facility] for tasks assessed/performed             No past medical history on file. No past surgical history on file. Patient Active Problem List   Diagnosis Date Noted   Foot pain, right 05/15/2022   Benign essential hypertension 09/02/2020   Diabetes (HCC) 09/02/2020   Dyslipidemia, goal LDL below 100 09/02/2020   Esophageal reflux 09/02/2020   Irritable bowel syndrome without diarrhea 09/02/2020   Primary osteoarthritis of knees, bilateral 09/02/2020   Psoriasis 09/02/2020   Vitamin D deficiency 09/02/2020   Abscess and cellulitis 02/08/2015   Recurrent umbilical hernia 10/23/2014   Hair loss 09/10/2014   Ventral hernia 09/10/2014    PCP: Hayley Labor, MD  REFERRING PROVIDER: Jolene Nearing, MD  REFERRING DIAG: 189.0  THERAPY DIAG:  Lymphedema, not elsewhere classified  Rationale for Evaluation and Treatment: Rehabilitation  ONSET DATE: 1.5 yrs after an episode of shingles  SUBJECTIVE:                                                                                                                                                                                           SUBJECTIVE STATEMENT: Hayley Barr presents to Occupational Therapy for BLE lymphedema. Pt arrives seated in transport wc. Pt denies LE lymphedema-related pain today. Pt has no new complaints today. Pt iis looking forward to receiving her RLE  compression garments.  PERTINENT HISTORY: HTN, OA, DM type 2, pre-existing B leg and foot lymphedema 2/2 suspected venous insufficiency, R>L. R leg Fx with reconstruction of Achilles;  chronic plantar  fasciitis, Negative for DVT in 1923, Hx R foot abscess and cellulitis, IBS, Psoriasis, bilateral neuropathy feet and legs, currently has PT 2 x weekly   PAIN:  Are you having pain? no, L leg and knee leg pain not rated/10 Location: B legs, feet and ankles, R>L Description: heaviness, stiffness, tightness What makes it better? elevation What makes it worse? Standing, walking, dependent sitting  PRECAUTIONS: Fall Risk;  Infection Risk, LYMPHEDEMA precautions, DM type  2 precautions  WEIGHT BEARING RESTRICTIONS: No  FALLS:  Has patient fallen in last 6 months? Yes. 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  COGNITION:  Overall cognitive status: Within functional limits for tasks assessed   OBSERVATIONS / OTHER ASSESSMENTS:  Mild-moderate, BLE lymphedema 2/2 suspected venous insufficiency and obesity ( weight induced lymphedema.  POSTURE: WFL  LE ROM: WFL for  clinical tasks  LE MMT: WFL for clinical task  LYMPHEDEMA ASSESSMENTS:   SURGERY TYPE/DATE: N/A, non cancer related  Hx INFECTIONS: NONE     Hx WOUNDS: currently has a dime sized non healing wound at base of R heel incision from achilles  reconstruction   FOTO functional outcome measure: Initial 09/29/23: 50%- 02/04/24: FOTO Discontinued  Lymphedema Life Impact Scale (LLIS) Initial 09/29/23: 47.06%  BLE COMPARATIVE LIMB VOLUMETRICS:   11/04/23 Initial  LANDMARK RIGHT   R LEG (A-D) 6686.8 ml  R THIGH (E-G) ml  R FULL LIMB (A-G) ml  Limb Volume differential (LVD)  0.5 %, L>R  Volume change since initial %  Volume change overall V  (Blank rows = not tested)  LANDMARK LEFT   L LEG (A-D) 6743.7 ml  L THIGH (E-G) ml  L FULL LIMB (A-G) ml  Limb Volume differential (LVD)  %  Volume change since initial %  Volume change overall %   (Blank rows = not tested)  6th visit RLE 12/10/23  LANDMARK RIGHT   R LEG (A-D) 5451.6 ml  R THIGH (E-G) ml  R FULL LIMB (A-G) ml  Limb Volume differential (LVD)  %  Volume change since initial  DEC 18.47% since 11/04/23  Volume change overall V  (Blank rows = not tested)   9th visit RLE 12/24/23  [facility] RIGHT   R LEG (A-D) 5451.6 ml  R THIGH (E-G) ml  R FULL LIMB (A-G) ml  Limb Volume differential (LVD)  %  Volume change since initial DECREASED 22.4% overall  Volume change since 12/10/23 DECREASED 4.8%  (Blank rows = not tested)  LLE COMPARATIVE LIMB VOLUMETRICS:   02/12/24 19 th visit ( last measured LE at initial Rx session on 11/04/23)   LANDMARK LEFT   L LEG (A-D) 5598.5 ml  L THIGH (E-G) ml  L FULL LIMB (A-G) ml  Limb Volume differential (LVD)   7.8%, L>R%  Volume change since initial  DECREASED 16.98%%  Volume change overall %   (Blank rows = not tested)  GAIT: Distance walked: arrived in transport wc. Able to transfer to Rx bed and back to transport wc using Single point cane (modified independence)  TODAY'S TREATMENT:  LLE/LLQ MLD with simultaneous skin care . Also performed fibrosis techniques to medial and lateral malleolus  Pt education re  LE self-care-ongoing Multilayer compression wraps  from toes to below knee on L  PATIENT EDUCATION:  Continued Pt/ CG edu for lymphedema self care home program throughout session. Topics include outcome of comparative limb volumetrics- starting limb volume differentials (LVDs), technology and gradient techniques used for short stretch, multilayer compression wrapping, simple self-MLD, therapeutic lymphatic pumping exercises, skin/nail care, LE precautions,. compression garment recommendations and specifications, wear and care schedule and compression garment donning / doffing w assistive devices. Discussed progress towards all OT goals since commencing CDT. All questions answered to the Pt's satisfaction. Good return. Person educated: Patient and family Education method: Explanation, Demonstration, and Handouts Education comprehension: verbalized understanding, returned demonstration, verbal cues required, and needs further education  HOME EXERCISE PROGRAM: BLE lymphatic pumping there ex using- 1 set of 10 reps, each exercise in order-  1-2 x daily, bilaterally Simple self MLD 1 x daily Daily skin care and inspection to increase hydration, skin mobility and decrease infection risk- can be done during MLD Compression wraps 23/7 until garment fitting complete (multilayer compression wraps to R LEG using 1 each of 8, 10, and 12 cm wide short stretch compression wraps over single layer of stockinett and Rosidal Soft foam ( 0.4 cm thick) . Finished wraps with stretch net to keep tape in place. )  ASSESSMENT: CLINICAL IMPRESSION: Pt tolerated LLE/LLQ MLD and multilayer compression wrapping without difficulty today. Limb volume reduction continues to decrease slowly. Skin condition cont to improve.  Cont OT frequency recently decreased to 1 x weekly to conserve visits and finances.  Fit LLE custom garments and devices as soon as delivered, then transition to self-management mode of CDT.   Cont as per POC.   (10/29/23 INITIAL EVALUATION:  Hayley Barr is a 68 yo female presenting with mild, stage 2, BLE lymphedema, R>L.  Longstanding, mild, B ankle swelling is exacerbated for the past 1.5 years s/P an episode of shingles affecting the R leg, and a subsequent fall resulting in ankle fracture with Achilles reconstruction and ongoing, non-healing wound at the distal end of surgical scar. This situation is concerning re elevated infection risk from lymphatic dysfunction in the context of DM type II,  and ongoing functional decline due to associated problems with ambulation and functional mobility.  Lymphedema occurs secondary to orthopedic trauma when the injury damages lymphatic vessels and structures in the area, leading to a buildup of fluid carrying cellular debris, waste products, bacteria, viruses, and damaged or abnormal cells in the tissues causing protein-rich swelling in the affected body part. This secondary lymphedema, often arising from significant soft tissue trauma accompanying the fracture, can potentially delay wound healing and complicate fracture management.   Progressing BLE lymphedema limits Hayley Kainz's functional performance in all occupational domains, including functional ambulation and transfers, standing tolerance, dependent sitting for more than 15 minutes,  basic and instrumental ADLs performance, including lower body dressing, LB bathing, fitting preferred street shoes and LB clothing; driving, shopping, and home management . Lymphedema and associated pain limits Pt's ability to perform productive activities and  leisure pursuits and to participate in socialization at home and in the community.  BLE lymphedema contributes to elevated infection risk and increased falls risk due to body asymmetry. Hayley Bachus will benefit from skilled OT for Complete Decongestive Therapy (CDT) the gold standard of lymphedema care, including manual lymphatic drainage (MLD), skin  care to limit infection risk and increase skin excursion, lymphatic pumping exercise, and during the Intensive Phase multilayer, gradient compression bandaging  to reduce limb volume. Once limb volume reduction reaches a clinical plateau custom compression garments that provide appropriate fit, compression and containment are fitted. Throughout the treatment course Pt/ caregiver will learn lymphedema prevention strategies and precautions, learn to perform all LE self-care home program components. Without skilled OT for lymphedema care, Pt's condition will progress and further functional decline is expected.)    OBJECTIVE IMPAIRMENTS: Abnormal gait, decreased activity tolerance, decreased balance, decreased knowledge of condition, decreased knowledge of use of DME, decreased mobility, difficulty walking, decreased ROM, decreased strength, increased edema, impaired flexibility, impaired sensation, improper body mechanics, obesity, pain, and chronic , progressive, BLE swelling with related tissue changes and thickening, increased infection risk, non-healing wound .   ACTIVITY LIMITATIONS: Difficulty performing basic and instrumental ADLs (LB dressing, LB bathing,bathing, toileting, dressing, hygiene/grooming, and functional ambulation. Difficulty performing leisure pursuits and productive activities requiring standing and/ or walking for > 19 minutes  PARTICIPATION LIMITATIONS:  Impaired social participation, decreased participation in church  PERSONAL FACTORS: Behavior pattern, Fitness, Past/current experiences, Social background, Time since onset of injury/illness/exacerbation, and 3+ comorbidities: OA, hx R ankle fracture and non-healing wound, hx plantar fasciitis  are also affecting patient's functional outcome.   REHAB POTENTIAL: Good  CLINICAL DECISION MAKING: Evolving/moderate complexity  EVALUATION COMPLEXITY: Moderate   GOALS: Goals reviewed with patient? Yes  SHORT TERM GOALS: Target  date: 4th OT Rx visit   Pt will demonstrate understanding of lymphedema precautions and prevention strategies with modified independence using a printed reference to identify at least 5 precautions and discussing how s/he may implement them into daily life to reduce risk of progression with extra time. Baseline:Max A Goal status: GOAL MET  2.  Pt will be able to apply multilayer, knee length, gradient, compression wraps to one leg at a time from toes to below knee with modified independence (extra time) to decrease limb volume, to limit infection risk, and to limit lymphedema progression.  Baseline: Dependent Goal status: GOAL MET  LONG TERM GOALS: Target date: 02/18/24  Given this patient's Intake score of 50 % on the functional outcomes FOTO tool, patient will experience an increase in function of 5 points to improve basic and instrumental ADLs performance, including lymphedema self-care.  Baseline: 50 Goal status: INITIAL. 12/25/23 GOAL DEFERRED. FOTO tool discontinued.  2.  Given this patient's Intake score of 47.06 % on the Lymphedema Life Impact Scale (LLIS), patient will experience a reduction of at least 5 points in her perceived level of functional impairment resulting from lymphedema to improve functional performance and quality of life (QOL). Baseline: 47.06 % Goal status: PROGRESSING  3.  Pt will achieve at least a 10% volume reduction in B legs to return limb to typical size and shape, to limit infection risk and LE progression, to decrease pain, to improve function. Baseline: Dependent Goal status: Achieved w R LEG w/ overall reduction to date of 22.4%. 02/12/24 LLE decreased by 16.98%  date. GOAL MET  4.  Pt will obtain appropriate compression garments/devices and achieve modified independence (extra time + assistive devices) with donning/doffing to optimize limb volume reductions and limit LE progression over time. Baseline: Dependent Goal status: PARTIALLY MET . Fitted RLE  garments last session.   5. During Intensive phase CDT , with modified independence, Pt will achieve at least 85% compliance with all lymphedema self-care home program components, including daily skin care, compression wraps and /or garments, simple self MLD and lymphatic pumping therex to habituate LE self care protocol  into ADLs for  optimal LE self-management over time. Baseline: Dependent Goal status:PROGRESSING  PLAN:  OT FREQUENCY: 1 x/week and PRN  OT DURATION: 12 weeks and PRN  PLANNED INTERVENTIONS: 97110-Therapeutic exercises, 97530- Therapeutic activity, 97140- Manual therapy, Dry Needling, Manual lymph drainage, and Compression bandaging Complete Decongestive Therapy: Manual lympathic drainage, skin care,    compression wraps,, then fit with appropriate compression garments during Self-management Phase.  PLAN FOR NEXT SESSION:  LLE MLD w simultaneous skin care Multilayer compression wraps Pt edu for lymphedema self-ca home program.  Arnold Bicker, Hayley, OTR/L, CLT-LANA 03/09/24 3:12 PM

## 2024-03-13 ENCOUNTER — Ambulatory Visit: Payer: Medicare PPO | Admitting: Occupational Therapy

## 2024-03-13 DIAGNOSIS — I890 Lymphedema, not elsewhere classified: Secondary | ICD-10-CM | POA: Diagnosis not present

## 2024-03-13 NOTE — Therapy (Signed)
 OUTPATIENT OCCUPATIONAL THERAPY TREATMENT NOTE   BILATERAL LOWER EXTREMITY LYMPHEDEMA   Patient Name: Hayley Barr MRN: [id number] DOB:Jul 27, 1956, 68 y.o., female Today's Date: 03/13/2024  REPORTING PERIOD: 01/03/24 - 02/17/24  END OF SESSION:   OT End of Session - 03/13/24 1012     Visit Number 27    Number of Visits 36    Date for OT Re-Evaluation 05/03/24    OT Start Time 1002    OT Stop Time 1100    OT Time Calculation (min) 58 min    Activity Tolerance Patient tolerated treatment well;No increased pain    Behavior During Therapy [facility] for tasks assessed/performed             No past medical history on file. No past surgical history on file. Patient Active Problem List   Diagnosis Date Noted   Foot pain, right 05/15/2022   Benign essential hypertension 09/02/2020   Diabetes (HCC) 09/02/2020   Dyslipidemia, goal LDL below 100 09/02/2020   Esophageal reflux 09/02/2020   Irritable bowel syndrome without diarrhea 09/02/2020   Primary osteoarthritis of knees, bilateral 09/02/2020   Psoriasis 09/02/2020   Vitamin D deficiency 09/02/2020   Abscess and cellulitis 02/08/2015   Recurrent umbilical hernia 10/23/2014   Hair loss 09/10/2014   Ventral hernia 09/10/2014    PCP: Hayley Mormon, MD  REFERRING PROVIDER: Bert Damita Skene, MD  REFERRING DIAG: 189.0  THERAPY DIAG:  Lymphedema, not elsewhere classified  Rationale for Evaluation and Treatment: Rehabilitation  ONSET DATE: 1.5 yrs after an episode of shingles  SUBJECTIVE:                                                                                                                                                                                           SUBJECTIVE STATEMENT: Hayley Barr presents to Occupational Therapy for BLE lymphedema. Pt arrives seated in transport wc. Pt denies LE lymphedema-related pain today. Pt has no new complaints today. Pt iis looking forward to receiving her RLE  compression garments. Pt reports she spoke with referring provider's office about compression garments prescription, and they denied this b/c they have not seen her in 6 months. Pt states she worked with DME vendor to obtain script from another one of her providers. Pt requests reduced OT frequency due to delay in fitting R garments.  PERTINENT HISTORY: HTN, OA, DM type 2, pre-existing B leg and foot lymphedema 2/2 suspected venous insufficiency, R>L. R leg Fx with reconstruction of Achilles;  chronic plantar  fasciitis, Negative for DVT in 1923, Hx R foot abscess and cellulitis, IBS, Psoriasis, bilateral neuropathy feet and legs, currently has PT 2 x  weekly   PAIN:  Are you having pain? no, L leg and knee leg pain not rated/10 Location: B legs, feet and ankles, R>L Description: heaviness, stiffness, tightness What makes it better? elevation What makes it worse? Standing, walking, dependent sitting  PRECAUTIONS: Fall Risk;  Infection Risk, LYMPHEDEMA precautions, DM type 2 precautions  WEIGHT BEARING RESTRICTIONS: No  FALLS:  Has patient fallen in last 6 months? Yes. Number of falls 1  LIVING ENVIRONMENT: Lives with: lives alone Lives in: House/apartment Stairs: Yes; Internal: 16 steps; on left going up Has following equipment at home: Quad cane small base, Environmental Consultant - 4 wheeled, Wheelchair (manual), shower chair, and Grab bars  OCCUPATION: retired orthoptist at [employer]: reading, working out on recumbent bike x weekly  HAND DOMINANCE: right   PRIOR LEVEL OF FUNCTION: Independent  PATIENT GOALS: I want to return to activities, walking, cooking for myself, traveling, going to church   OBJECTIVE: Note: Objective measures were completed at Evaluation unless otherwise noted.  COGNITION:  Overall cognitive status: Within functional limits for tasks assessed   OBSERVATIONS / OTHER ASSESSMENTS:  Mild-moderate, BLE lymphedema 2/2 suspected venous insufficiency and obesity ( weight  induced lymphedema.  POSTURE: WFL  LE ROM: WFL for  clinical tasks  LE MMT: WFL for clinical task  LYMPHEDEMA ASSESSMENTS:   SURGERY TYPE/DATE: N/A, non cancer related  Hx INFECTIONS: NONE     Hx WOUNDS: currently has a dime sized non healing wound at base of R heel incision from achilles reconstruction   FOTO functional outcome measure: Initial 09/29/23: 50%- 02/04/24: FOTO Discontinued  Lymphedema Life Impact Scale (LLIS) Initial 09/29/23: 47.06%  BLE COMPARATIVE LIMB VOLUMETRICS:   11/04/23 Initial  LANDMARK RIGHT   R LEG (A-D) 6686.8 ml  R THIGH (E-G) ml  R FULL LIMB (A-G) ml  Limb Volume differential (LVD)  0.5 %, L>R  Volume change since initial %  Volume change overall V  (Blank rows = not tested)  LANDMARK LEFT   L LEG (A-D) 6743.7 ml  L THIGH (E-G) ml  L FULL LIMB (A-G) ml  Limb Volume differential (LVD)  %  Volume change since initial %  Volume change overall %   (Blank rows = not tested)  6th visit RLE 12/10/23  LANDMARK RIGHT   R LEG (A-D) 5451.6 ml  R THIGH (E-G) ml  R FULL LIMB (A-G) ml  Limb Volume differential (LVD)  %  Volume change since initial  DEC 18.47% since 11/04/23  Volume change overall V  (Blank rows = not tested)   9th visit RLE 12/24/23  [facility] RIGHT   R LEG (A-D) 5451.6 ml  R THIGH (E-G) ml  R FULL LIMB (A-G) ml  Limb Volume differential (LVD)  %  Volume change since initial DECREASED 22.4% overall  Volume change since 12/10/23 DECREASED 4.8%  (Blank rows = not tested)  LLE COMPARATIVE LIMB VOLUMETRICS:   02/12/24 19 th visit ( last measured LE at initial Rx session on 11/04/23)   LANDMARK LEFT   L LEG (A-D) 5598.5 ml  L THIGH (E-G) ml  L FULL LIMB (A-G) ml  Limb Volume differential (LVD)   7.8%, L>R%  Volume change since initial  DECREASED 16.98%%  Volume change overall %   (Blank rows = not tested)  GAIT: Distance walked: arrived in transport wc. Able to transfer to Rx bed and back to transport wc using Single  point cane (modified independence)  TODAY'S TREATMENT:  LLE/LLQ MLD with simultaneous skin care .  Also performed fibrosis techniques to medial and lateral malleolus                                                                                                                                        Pt education re  LE self-care-ongoing Multilayer compression wraps  from toes to below knee on L  PATIENT EDUCATION:  Continued Pt/ CG edu for lymphedema self care home program throughout session. Topics include outcome of comparative limb volumetrics- starting limb volume differentials (LVDs), technology and gradient techniques used for short stretch, multilayer compression wrapping, simple self-MLD, therapeutic lymphatic pumping exercises, skin/nail care, LE precautions,. compression garment recommendations and specifications, wear and care schedule and compression garment donning / doffing w assistive devices. Discussed progress towards all OT goals since commencing CDT. All questions answered to the Pt's satisfaction. Good return. Person educated: Patient and family Education method: Explanation, Demonstration, and Handouts Education comprehension: verbalized understanding, returned demonstration, verbal cues required, and needs further education  HOME EXERCISE PROGRAM: BLE lymphatic pumping there ex using- 1 set of 10 reps, each exercise in order-  1-2 x daily, bilaterally Simple self MLD 1 x daily Daily skin care and inspection to increase hydration, skin mobility and decrease infection risk- can be done during MLD Compression wraps 23/7 until garment fitting complete (multilayer compression wraps to R LEG using 1 each of 8, 10, and 12 cm wide short stretch compression wraps over single layer of stockinett and Rosidal Soft foam ( 0.4 cm thick) . Finished wraps with stretch net to keep tape in place. )  ASSESSMENT: CLINICAL IMPRESSION: Pt tolerated LLE/LLQ MLD and multilayer compression wrapping  without difficulty today. Limb volume reduction continues to decrease slowly. Skin condition cont to improve.  Cont OT frequency recently decreased to 1 x weekly to conserve visits and finances.  Fit LLE custom garments and devices as soon as delivered, then transition to self-management mode of CDT.  Reducing OT frequency for LE care to 1 x every other week. Pt remains 100% compliant with LE self care home program and is able to manage well, I believe, with that interval while we're awaiting garment fitting. Pt agrees to call PRN.  (10/29/23 INITIAL EVALUATION:  Davielle Johnsey is a 68 yo female presenting with mild, stage 2, BLE lymphedema, R>L.  Longstanding, mild, B ankle swelling is exacerbated for the past 1.5 years s/P an episode of shingles affecting the R leg, and a subsequent fall resulting in ankle fracture with Achilles reconstruction and ongoing, non-healing wound at the distal end of surgical scar. This situation is concerning re elevated infection risk from lymphatic dysfunction in the context of DM type II,  and ongoing functional decline due to associated problems with ambulation and functional mobility.  Lymphedema occurs secondary to orthopedic trauma when the injury damages lymphatic vessels and structures in the area, leading to a buildup of fluid carrying cellular debris, waste  products, bacteria, viruses, and damaged or abnormal cells in the tissues causing protein-rich swelling in the affected body part. This secondary lymphedema, often arising from significant soft tissue trauma accompanying the fracture, can potentially delay wound healing and complicate fracture management.   Progressing BLE lymphedema limits Hayley Boroff's functional performance in all occupational domains, including functional ambulation and transfers, standing tolerance, dependent sitting for more than 15 minutes,  basic and instrumental ADLs performance, including lower body dressing, LB bathing, fitting  preferred street shoes and LB clothing; driving, shopping, and home management . Lymphedema and associated pain limits Pt's ability to perform productive activities and  leisure pursuits and to participate in socialization at home and in the community.  BLE lymphedema contributes to elevated infection risk and increased falls risk due to body asymmetry. Hayley Godbey will benefit from skilled OT for Complete Decongestive Therapy (CDT) the gold standard of lymphedema care, including manual lymphatic drainage (MLD), skin care to limit infection risk and increase skin excursion, lymphatic pumping exercise, and during the Intensive Phase multilayer, gradient compression bandaging to reduce limb volume. Once limb volume reduction reaches a clinical plateau custom compression garments that provide appropriate fit, compression and containment are fitted. Throughout the treatment course Pt/ caregiver will learn lymphedema prevention strategies and precautions, learn to perform all LE self-care home program components. Without skilled OT for lymphedema care, Pt's condition will progress and further functional decline is expected.)    OBJECTIVE IMPAIRMENTS: Abnormal gait, decreased activity tolerance, decreased balance, decreased knowledge of condition, decreased knowledge of use of DME, decreased mobility, difficulty walking, decreased ROM, decreased strength, increased edema, impaired flexibility, impaired sensation, improper body mechanics, obesity, pain, and chronic , progressive, BLE swelling with related tissue changes and thickening, increased infection risk, non-healing wound .   ACTIVITY LIMITATIONS: Difficulty performing basic and instrumental ADLs (LB dressing, LB bathing,bathing, toileting, dressing, hygiene/grooming, and functional ambulation. Difficulty performing leisure pursuits and productive activities requiring standing and/ or walking for > 19 minutes  PARTICIPATION LIMITATIONS:  Impaired social  participation, decreased participation in church  PERSONAL FACTORS: Behavior pattern, Fitness, Past/current experiences, Social background, Time since onset of injury/illness/exacerbation, and 3+ comorbidities: OA, hx R ankle fracture and non-healing wound, hx plantar fasciitis  are also affecting patient's functional outcome.   REHAB POTENTIAL: Good  CLINICAL DECISION MAKING: Evolving/moderate complexity  EVALUATION COMPLEXITY: Moderate   GOALS: Goals reviewed with patient? Yes  SHORT TERM GOALS: Target date: 4th OT Rx visit   Pt will demonstrate understanding of lymphedema precautions and prevention strategies with modified independence using a printed reference to identify at least 5 precautions and discussing how s/he may implement them into daily life to reduce risk of progression with extra time. Baseline:Max A Goal status: GOAL MET  2.  Pt will be able to apply multilayer, knee length, gradient, compression wraps to one leg at a time from toes to below knee with modified independence (extra time) to decrease limb volume, to limit infection risk, and to limit lymphedema progression.  Baseline: Dependent Goal status: GOAL MET  LONG TERM GOALS: Target date: 02/18/24  Given this patient's Intake score of 50 % on the functional outcomes FOTO tool, patient will experience an increase in function of 5 points to improve basic and instrumental ADLs performance, including lymphedema self-care.  Baseline: 50 Goal status: INITIAL. 12/25/23 GOAL DEFERRED. FOTO tool discontinued.  2.  Given this patient's Intake score of 47.06 % on the Lymphedema Life Impact Scale (LLIS), patient will experience a reduction of at least  5 points in her perceived level of functional impairment resulting from lymphedema to improve functional performance and quality of life (QOL). Baseline: 47.06 % Goal status: PROGRESSING  3.  Pt will achieve at least a 10% volume reduction in B legs to return limb to typical  size and shape, to limit infection risk and LE progression, to decrease pain, to improve function. Baseline: Dependent Goal status: Achieved w R LEG w/ overall reduction to date of 22.4%. 02/12/24 LLE decreased by 16.98%  date. GOAL MET  4.  Pt will obtain appropriate compression garments/devices and achieve modified independence (extra time + assistive devices) with donning/doffing to optimize limb volume reductions and limit LE progression over time. Baseline: Dependent Goal status: PARTIALLY MET . Fitted RLE garments last session.   5. During Intensive phase CDT , with modified independence, Pt will achieve at least 85% compliance with all lymphedema self-care home program components, including daily skin care, compression wraps and /or garments, simple self MLD and lymphatic pumping therex to habituate LE self care protocol  into ADLs for optimal LE self-management over time. Baseline: Dependent Goal status:PROGRESSING  PLAN:  OT FREQUENCY: 1 x/ every other week and PRN  OT DURATION: 12 weeks and PRN  PLANNED INTERVENTIONS: 97110-Therapeutic exercises, 97530- Therapeutic activity, 97140- Manual therapy, Dry Needling, Manual lymph drainage, and Compression bandaging Complete Decongestive Therapy: Manual lympathic drainage, skin care,    compression wraps,, then fit with appropriate compression garments during Self-management Phase.  PLAN FOR NEXT SESSION:  LLE MLD w simultaneous skin care Multilayer compression wraps Pt edu for lymphedema self-ca home program.  Zebedee Dec, Hayley, OTR/L, CLT-LANA 03/13/24 11:05 AM

## 2024-03-16 ENCOUNTER — Ambulatory Visit: Payer: Medicare PPO | Admitting: Occupational Therapy

## 2024-03-20 ENCOUNTER — Ambulatory Visit: Payer: Medicare PPO | Admitting: Occupational Therapy

## 2024-03-23 ENCOUNTER — Ambulatory Visit: Payer: Medicare PPO | Admitting: Occupational Therapy

## 2024-03-27 ENCOUNTER — Ambulatory Visit: Payer: Medicare PPO | Attending: Cardiothoracic Surgery | Admitting: Occupational Therapy

## 2024-03-27 DIAGNOSIS — I890 Lymphedema, not elsewhere classified: Secondary | ICD-10-CM | POA: Diagnosis present

## 2024-03-27 NOTE — Therapy (Signed)
 OUTPATIENT OCCUPATIONAL THERAPY TREATMENT NOTE   BILATERAL LOWER EXTREMITY LYMPHEDEMA   Patient Name: Hayley Barr MRN: [id number] DOB:[birth year], 68 y.o., female Today's Date: 03/27/2024  REPORTING PERIOD: 01/03/24 - 02/17/24  END OF SESSION:   OT End of Session - 03/27/24 1003     Visit Number 28    Number of Visits 36    Date for OT Re-Evaluation 05/03/24    OT Start Time 1005    OT Stop Time 1105    OT Time Calculation (min) 60 min    Activity Tolerance Patient tolerated treatment well;No increased pain    Behavior During Therapy [facility] for tasks assessed/performed             No past medical history on file. No past surgical history on file. Patient Active Problem List   Diagnosis Date Noted   Foot pain, right 05/15/2022   Benign essential hypertension 09/02/2020   Diabetes (HCC) 09/02/2020   Dyslipidemia, goal LDL below 100 09/02/2020   Esophageal reflux 09/02/2020   Irritable bowel syndrome without diarrhea 09/02/2020   Primary osteoarthritis of knees, bilateral 09/02/2020   Psoriasis 09/02/2020   Vitamin D deficiency 09/02/2020   Abscess and cellulitis 02/08/2015   Recurrent umbilical hernia 10/23/2014   Hair loss 09/10/2014   Ventral hernia 09/10/2014    PCP: Gilda Labor, MD  REFERRING PROVIDER: Jolene Nearing, MD  REFERRING DIAG: 189.0  THERAPY DIAG:  Lymphedema, not elsewhere classified  Rationale for Evaluation and Treatment: Rehabilitation  ONSET DATE: 1.5 yrs after an episode of shingles  SUBJECTIVE:                                                                                                                                                                                           SUBJECTIVE STATEMENT: Hayley Barr presents to Occupational Therapy for BLE lymphedema. Hayley Barr arrives seated in transport wc. She was last seen on 03/13/24 when visit frequency was decreased due to long wait from DME vendor for custom compression garment  and financial hardship resulting from delay. Hayley Barr states she is doing very well. She continues to wrap the L Leg and wear her custom garment on the R.  Hayley Barr denies LE lymphedema-related pain today. Hayley Barr reports wound on back of R ankle is healed.   PERTINENT HISTORY: HTN, OA, DM type 2, pre-existing B leg and foot lymphedema 2/2 suspected venous insufficiency, R>L. R leg Fx with reconstruction of Achilles;  chronic plantar  fasciitis, Negative for DVT in 1923, Hx R foot abscess and cellulitis, IBS, Psoriasis, bilateral neuropathy feet and legs, currently has Hayley Barr 2 x weekly   PAIN:  Are you  having pain? no, L leg and knee leg pain not rated/10 Location: B legs, feet and ankles, R>L Description: heaviness, stiffness, tightness What makes it better? elevation What makes it worse? Standing, walking, dependent sitting  PRECAUTIONS: Fall Risk;  Infection Risk, LYMPHEDEMA precautions, DM type 2 precautions  WEIGHT BEARING RESTRICTIONS: No  FALLS:  Has patient fallen in last 6 months? Yes. 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  COGNITION:  Overall cognitive status: Within functional limits for tasks assessed   OBSERVATIONS / OTHER ASSESSMENTS:  Mild-moderate, BLE lymphedema 2/2 suspected venous insufficiency and obesity ( weight induced lymphedema.  POSTURE: WFL  LE ROM: WFL for  clinical tasks  LE MMT: WFL for  clinical task  LYMPHEDEMA ASSESSMENTS:   SURGERY TYPE/DATE: N/A, non cancer related  Hx INFECTIONS: NONE     Hx WOUNDS: currently has a dime sized non healing wound at base of R heel incision from achilles reconstruction   FOTO functional outcome measure: Initial 09/29/23: 50%- 02/04/24: FOTO Discontinued  Lymphedema Life Impact Scale (LLIS) Initial 09/29/23: 47.06%  BLE COMPARATIVE LIMB VOLUMETRICS:   11/04/23 Initial  LANDMARK RIGHT   R LEG (A-D) 6686.8 ml  R THIGH (E-G) ml  R FULL LIMB (A-G) ml  Limb Volume differential (LVD)  0.5 %, L>R  Volume change since initial %  Volume change overall V  (Blank rows = not tested)  LANDMARK LEFT   L LEG (A-D) 6743.7 ml  L THIGH (E-G) ml  L FULL LIMB (A-G) ml  Limb Volume differential (LVD)  %  Volume change since initial %  Volume change overall %   (Blank rows = not tested)  6th visit RLE 12/10/23  LANDMARK RIGHT   R LEG (A-D) 5451.6 ml  R THIGH (E-G) ml  R FULL LIMB (A-G) ml  Limb Volume differential (LVD)  %  Volume change since initial  DEC 18.47% since 11/04/23  Volume change overall V  (Blank rows = not tested)   9th visit RLE 12/24/23  [facility] RIGHT   R LEG (A-D) 5451.6 ml  R THIGH (E-G) ml  R FULL LIMB (A-G) ml  Limb Volume differential (LVD)  %  Volume change since initial DECREASED 22.4% overall  Volume change since 12/10/23 DECREASED 4.8%  (Blank rows = not tested)  LLE COMPARATIVE LIMB VOLUMETRICS:   02/12/24 19 th visit ( last measured LE at initial Rx session on 11/04/23)   LANDMARK LEFT   L LEG (A-D) 5598.5 ml  L THIGH (E-G) ml  L FULL LIMB (A-G) ml  Limb Volume differential (LVD)   7.8%, L>R%  Volume change since initial  DECREASED 16.98%%  Volume change overall %   (Blank rows = not tested)  GAIT: Distance walked: arrived in transport wc. Able to transfer to Rx bed and back to transport wc using Single point cane (modified independence)  TODAY'S TREATMENT:  LLE/LLQ MLD with simultaneous  skin care . Also performed fibrosis techniques to medial  and lateral malleolus                                                                                                                                        Hayley Barr education re  LE self-care-ongoing Multilayer compression wraps  from toes to below knee on L  PATIENT EDUCATION:  Continued Hayley Barr/ CG edu for lymphedema self care home program throughout session. Topics include outcome of comparative limb volumetrics- starting limb volume differentials (LVDs), technology and gradient techniques used for short stretch, multilayer compression wrapping, simple self-MLD, therapeutic lymphatic pumping exercises, skin/nail care, LE precautions,. compression garment recommendations and specifications, wear and care schedule and compression garment donning / doffing w assistive devices. Discussed progress towards all OT goals since commencing CDT. All questions answered to the Hayley Barr's satisfaction. Good return. Person educated: Patient and family Education method: Explanation, Demonstration, and Handouts Education comprehension: verbalized understanding, returned demonstration, verbal cues required, and needs further education  HOME EXERCISE PROGRAM: BLE lymphatic pumping there ex using- 1 set of 10 reps, each exercise in order-  1-2 x daily, bilaterally Simple self MLD 1 x daily Daily skin care and inspection to increase hydration, skin mobility and decrease infection risk- can be done during MLD Compression wraps 23/7 until garment fitting complete (multilayer compression wraps to R LEG using 1 each of 8, 10, and 12 cm wide short stretch compression wraps over single layer of stockinett and Rosidal Soft foam ( 0.4 cm thick) . Finished wraps with stretch net to keep tape in place. )  ASSESSMENT: CLINICAL IMPRESSION: Hayley Barr tolerated LLE/LLQ MLD and multilayer compression wrapping without difficulty today. Limb volume reduction continues to decrease slowly. Skin  condition cont to improve.  Cont OT frequency recently decreased to 1 x weekly to conserve visits and finances.  Fit LLE custom garments and devices as soon as delivered, then transition to self-management mode of CDT.  Reducing OT frequency for LE care to 1 x every other week. Hayley Barr remains 100% compliant with LE self care home program and is able to manage well, I believe, with that interval while we're awaiting garment fitting. Hayley Barr agrees to call PRN. Cont as per this POC.  (10/29/23 INITIAL EVALUATION:  Hayley Barr is a 68 yo female presenting with mild, stage 2, BLE lymphedema, R>L.  Longstanding, mild, B ankle swelling is exacerbated for the past 1.5 years s/P an episode of shingles affecting the R leg, and a subsequent fall resulting in ankle fracture with Achilles reconstruction and ongoing, non-healing wound at the distal end of surgical scar. This situation is concerning re elevated infection risk from lymphatic dysfunction in the context of DM type II,  and ongoing functional decline due to associated problems with ambulation and functional mobility.  Lymphedema occurs secondary to orthopedic trauma when the injury damages lymphatic vessels and structures in the area, leading to a buildup of fluid carrying cellular debris, waste products,  bacteria, viruses, and damaged or abnormal cells in the tissues causing protein-rich swelling in the affected body part. This secondary lymphedema, often arising from significant soft tissue trauma accompanying the fracture, can potentially delay wound healing and complicate fracture management.   Progressing BLE lymphedema limits Hayley Barr's functional performance in all occupational domains, including functional ambulation and transfers, standing tolerance, dependent sitting for more than 15 minutes,  basic and instrumental ADLs performance, including lower body dressing, LB bathing, fitting preferred street shoes and LB clothing; driving, shopping, and home  management . Lymphedema and associated pain limits Hayley Barr's ability to perform productive activities and  leisure pursuits and to participate in socialization at home and in the community.  BLE lymphedema contributes to elevated infection risk and increased falls risk due to body asymmetry. Hayley Barr will benefit from skilled OT for Complete Decongestive Therapy (CDT) the gold standard of lymphedema care, including manual lymphatic drainage (MLD), skin care to limit infection risk and increase skin excursion, lymphatic pumping exercise, and during the Intensive Phase multilayer, gradient compression bandaging to reduce limb volume. Once limb volume reduction reaches a clinical plateau custom compression garments that provide appropriate fit, compression and containment are fitted. Throughout the treatment course Hayley Barr/ caregiver will learn lymphedema prevention strategies and precautions, learn to perform all LE self-care home program components. Without skilled OT for lymphedema care, Hayley Barr's condition will progress and further functional decline is expected.)    OBJECTIVE IMPAIRMENTS: Abnormal gait, decreased activity tolerance, decreased balance, decreased knowledge of condition, decreased knowledge of use of DME, decreased mobility, difficulty walking, decreased ROM, decreased strength, increased edema, impaired flexibility, impaired sensation, improper body mechanics, obesity, pain, and chronic , progressive, BLE swelling with related tissue changes and thickening, increased infection risk, non-healing wound .   ACTIVITY LIMITATIONS: Difficulty performing basic and instrumental ADLs (LB dressing, LB bathing,bathing, toileting, dressing, hygiene/grooming, and functional ambulation. Difficulty performing leisure pursuits and productive activities requiring standing and/ or walking for > 19 minutes  PARTICIPATION LIMITATIONS:  Impaired social participation, decreased participation in church  PERSONAL FACTORS:  Behavior pattern, Fitness, Past/current experiences, Social background, Time since onset of injury/illness/exacerbation, and 3+ comorbidities: OA, hx R ankle fracture and non-healing wound, hx plantar fasciitis  are also affecting patient's functional outcome.   REHAB POTENTIAL: Good  CLINICAL DECISION MAKING: Evolving/moderate complexity  EVALUATION COMPLEXITY: Moderate   GOALS: Goals reviewed with patient? Yes  SHORT TERM GOALS: Target date: 4th OT Rx visit   Hayley Barr will demonstrate understanding of lymphedema precautions and prevention strategies with modified independence using a printed reference to identify at least 5 precautions and discussing how s/he may implement them into daily life to reduce risk of progression with extra time. Baseline:Max A Goal status: GOAL MET  2.  Hayley Barr will be able to apply multilayer, knee length, gradient, compression wraps to one leg at a time from toes to below knee with modified independence (extra time) to decrease limb volume, to limit infection risk, and to limit lymphedema progression.  Baseline: Dependent Goal status: GOAL MET  LONG TERM GOALS: Target date: 02/18/24  Given this patient's Intake score of 50 % on the functional outcomes FOTO tool, patient will experience an increase in function of 5 points to improve basic and instrumental ADLs performance, including lymphedema self-care.  Baseline: 50 Goal status: INITIAL. 12/25/23 GOAL DEFERRED. FOTO tool discontinued.  2.  Given this patient's Intake score of 47.06 % on the Lymphedema Life Impact Scale (LLIS), patient will experience a reduction of at least 5  points in her perceived level of functional impairment resulting from lymphedema to improve functional performance and quality of life (QOL). Baseline: 47.06 % Goal status: PROGRESSING  3.  Hayley Barr will achieve at least a 10% volume reduction in B legs to return limb to typical size and shape, to limit infection risk and LE progression, to decrease  pain, to improve function. Baseline: Dependent Goal status: Achieved w R LEG w/ overall reduction to date of 22.4%. 02/12/24 LLE decreased by 16.98%  date. GOAL MET  4.  Hayley Barr will obtain appropriate compression garments/devices and achieve modified independence (extra time + assistive devices) with donning/doffing to optimize limb volume reductions and limit LE progression over time. Baseline: Dependent Goal status: PARTIALLY MET . Fitted RLE garments last session.   5. During Intensive phase CDT , with modified independence, Hayley Barr will achieve at least 85% compliance with all lymphedema self-care home program components, including daily skin care, compression wraps and /or garments, simple self MLD and lymphatic pumping therex to habituate LE self care protocol  into ADLs for optimal LE self-management over time. Baseline: Dependent Goal status:GOAL MET  PLAN:  OT FREQUENCY: 1 x/ every other week and PRN  OT DURATION: 12 weeks and PRN  PLANNED INTERVENTIONS: 97110-Therapeutic exercises, 97530- Therapeutic activity, 97140- Manual therapy, Dry Needling, Manual lymph drainage, and Compression bandaging Complete Decongestive Therapy: Manual lympathic drainage, skin care,    compression wraps,, then fit with appropriate compression garments during Self-management Phase.  PLAN FOR NEXT SESSION:  LLE MLD w simultaneous skin care Multilayer compression wraps Hayley Barr edu for lymphedema self-ca home program.  Arnold Bicker, Hayley, OTR/L, CLT-LANA 03/27/24 11:17 AM

## 2024-03-30 ENCOUNTER — Ambulatory Visit: Payer: Medicare PPO | Admitting: Occupational Therapy

## 2024-04-03 ENCOUNTER — Ambulatory Visit: Payer: Medicare PPO | Admitting: Occupational Therapy

## 2024-04-06 ENCOUNTER — Ambulatory Visit: Payer: Medicare PPO | Admitting: Occupational Therapy

## 2024-04-10 ENCOUNTER — Ambulatory Visit: Payer: Medicare PPO | Admitting: Occupational Therapy

## 2024-04-10 DIAGNOSIS — I890 Lymphedema, not elsewhere classified: Secondary | ICD-10-CM

## 2024-04-10 NOTE — Therapy (Signed)
 OUTPATIENT OCCUPATIONAL THERAPY TREATMENT NOTE   BILATERAL LOWER EXTREMITY LYMPHEDEMA   Patient Name: Hayley Barr MRN: [id number] DOB:25-Jul-1956, 68 y.o., female Today's Date: 04/10/2024  REPORTING PERIOD: 01/03/24 - 02/17/24  END OF SESSION:   OT End of Session - 04/10/24 1010     Visit Number 28    Number of Visits 36    Date for OT Re-Evaluation 05/03/24    OT Start Time 1007    OT Stop Time 1107    OT Time Calculation (min) 60 min    Activity Tolerance Patient tolerated treatment well;No increased pain    Behavior During Therapy [facility] for tasks assessed/performed             No past medical history on file. No past surgical history on file. Patient Active Problem List   Diagnosis Date Noted   Foot pain, right 05/15/2022   Benign essential hypertension 09/02/2020   Diabetes (HCC) 09/02/2020   Dyslipidemia, goal LDL below 100 09/02/2020   Esophageal reflux 09/02/2020   Irritable bowel syndrome without diarrhea 09/02/2020   Primary osteoarthritis of knees, bilateral 09/02/2020   Psoriasis 09/02/2020   Vitamin D deficiency 09/02/2020   Abscess and cellulitis 02/08/2015   Recurrent umbilical hernia 10/23/2014   Hair loss 09/10/2014   Ventral hernia 09/10/2014    PCP: Gilda Labor, MD  REFERRING PROVIDER: Jolene Nearing, MD  REFERRING DIAG: 189.0  THERAPY DIAG:  Lymphedema, not elsewhere classified  Rationale for Evaluation and Treatment: Rehabilitation  ONSET DATE: 1.5 yrs after an episode of shingles  SUBJECTIVE:                                                                                                                                                                                           SUBJECTIVE STATEMENT: [name] presents to Occupational Therapy for BLE lymphedema. Pt arrives seated in transport wc. She was last seen on 03/27/24 when visit frequency was decreased due to long wait from DME vendor for custom compression garment  and financial hardship resulting from delay. Pt states she is doing very well. She continues to wrap the L Leg and wear her custom garment on the R.  Pt denies LE lymphedema-related pain today.    PERTINENT HISTORY: HTN, OA, DM type 2, pre-existing B leg and foot lymphedema 2/2 suspected venous insufficiency, R>L. R leg Fx with reconstruction of Achilles;  chronic plantar  fasciitis, Negative for DVT in 1923, Hx R foot abscess and cellulitis, IBS, Psoriasis, bilateral neuropathy feet and legs, currently has PT 2 x weekly   PAIN:  Are you having pain? no, L leg and knee leg pain  not rated/10 Location: B legs, feet and ankles, R>L Description: heaviness, stiffness, tightness What makes it better? elevation What makes it worse? Standing, walking, dependent sitting  PRECAUTIONS: Fall Risk;  Infection Risk, LYMPHEDEMA precautions, DM type 2 precautions  WEIGHT BEARING RESTRICTIONS: No  FALLS:  Has patient fallen in last 6 months? Yes. 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  COGNITION:  Overall cognitive status: Within functional limits for tasks assessed   OBSERVATIONS / OTHER ASSESSMENTS:  Mild-moderate, BLE lymphedema 2/2 suspected venous insufficiency and obesity ( weight induced lymphedema.  POSTURE: WFL  LE ROM: WFL for  clinical tasks  LE MMT: WFL for clinical task  LYMPHEDEMA ASSESSMENTS:    SURGERY TYPE/DATE: N/A, non cancer related  Hx INFECTIONS: NONE     Hx WOUNDS: currently has a dime sized non healing wound at base of R heel incision from achilles reconstruction   FOTO functional outcome measure: Initial 09/29/23: 50%- 02/04/24: FOTO Discontinued  Lymphedema Life Impact Scale (LLIS) Initial 09/29/23: 47.06%  BLE COMPARATIVE LIMB VOLUMETRICS:   11/04/23 Initial  LANDMARK RIGHT   R LEG (A-D) 6686.8 ml  R THIGH (E-G) ml  R FULL LIMB (A-G) ml  Limb Volume differential (LVD)  0.5 %, L>R  Volume change since initial %  Volume change overall V  (Blank rows = not tested)  LANDMARK LEFT   L LEG (A-D) 6743.7 ml  L THIGH (E-G) ml  L FULL LIMB (A-G) ml  Limb Volume differential (LVD)  %  Volume change since initial %  Volume change overall %   (Blank rows = not tested)  6th visit RLE 12/10/23  LANDMARK RIGHT   R LEG (A-D) 5451.6 ml  R THIGH (E-G) ml  R FULL LIMB (A-G) ml  Limb Volume differential (LVD)  %  Volume change since initial  DEC 18.47% since 11/04/23  Volume change overall V  (Blank rows = not tested)   9th visit RLE 12/24/23  [facility] RIGHT   R LEG (A-D) 5451.6 ml  R THIGH (E-G) ml  R FULL LIMB (A-G) ml  Limb Volume differential (LVD)  %  Volume change since initial DECREASED 22.4% overall  Volume change since 12/10/23 DECREASED 4.8%  (Blank rows = not tested)  LLE COMPARATIVE LIMB VOLUMETRICS:   02/12/24 19 th visit ( last measured LE at initial Rx session on 11/04/23)   LANDMARK LEFT   L LEG (A-D) 5598.5 ml  L THIGH (E-G) ml  L FULL LIMB (A-G) ml  Limb Volume differential (LVD)   7.8%, L>R%  Volume change since initial  DECREASED 16.98%%  Volume change overall %   (Blank rows = not tested)  GAIT: Distance walked: arrived in transport wc. Able to transfer to Rx bed and back to transport wc using Single point cane (modified independence)  TODAY'S TREATMENT:  LLE/LLQ MLD with simultaneous skin care . Also performed fibrosis  techniques to medial and lateral malleolus  Pt education re  LE self-care-ongoing Multilayer compression wraps  from toes to below knee on L  PATIENT EDUCATION:  Continued Pt/ CG edu for lymphedema self care home program throughout session. Topics include outcome of comparative limb volumetrics- starting limb volume differentials (LVDs), technology and gradient techniques used for short stretch, multilayer compression wrapping, simple self-MLD, therapeutic lymphatic pumping exercises, skin/nail care, LE precautions,. compression garment recommendations and specifications, wear and care schedule and compression garment donning / doffing w assistive devices. Discussed progress towards all OT goals since commencing CDT. All questions answered to the Pt's satisfaction. Good return. Person educated: Patient and family Education method: Explanation, Demonstration, and Handouts Education comprehension: verbalized understanding, returned demonstration, verbal cues required, and needs further education  HOME EXERCISE PROGRAM: BLE lymphatic pumping there ex using- 1 set of 10 reps, each exercise in order-  1-2 x daily, bilaterally Simple self MLD 1 x daily Daily skin care and inspection to increase hydration, skin mobility and decrease infection risk- can be done during MLD Compression wraps 23/7 until garment fitting complete (multilayer compression wraps to R LEG using 1 each of 8, 10, and 12 cm wide short stretch compression wraps over single layer of stockinett and Rosidal Soft foam ( 0.4 cm thick) . Finished wraps with stretch net to keep tape in place. )  ASSESSMENT: CLINICAL IMPRESSION: Pt tolerated LLE/LLQ MLD and multilayer compression wrapping without difficulty today. Limb volume reduction continues to decrease slowly. Skin condition cont to improve.  Cont OT  frequency recently decreased to 1 x weekly to conserve visits and finances.  Fit LLE custom garments and devices as soon as delivered, then transition to self-management mode of CDT.  Reducing OT frequency for LE care to 1 x every other week. Pt remains 100% compliant with LE self care home program and is able to manage well, I believe, with that interval while we're awaiting garment fitting. Pt agrees to call PRN. Cont as per this POC.  (10/29/23 INITIAL EVALUATION:  Jessicah Runyon is a 68 yo female presenting with mild, stage 2, BLE lymphedema, R>L.  Longstanding, mild, B ankle swelling is exacerbated for the past 1.5 years s/P an episode of shingles affecting the R leg, and a subsequent fall resulting in ankle fracture with Achilles reconstruction and ongoing, non-healing wound at the distal end of surgical scar. This situation is concerning re elevated infection risk from lymphatic dysfunction in the context of DM type II,  and ongoing functional decline due to associated problems with ambulation and functional mobility.  Lymphedema occurs secondary to orthopedic trauma when the injury damages lymphatic vessels and structures in the area, leading to a buildup of fluid carrying cellular debris, waste products, bacteria, viruses, and damaged or abnormal cells in the tissues causing protein-rich swelling in the affected body part. This secondary lymphedema, often arising from significant soft tissue trauma accompanying the fracture, can potentially delay wound healing and complicate fracture management.   Progressing BLE lymphedema limits Ms Miu's functional performance in all occupational domains, including functional ambulation and transfers, standing tolerance, dependent sitting for more than 15 minutes,  basic and instrumental ADLs performance, including lower body dressing, LB bathing, fitting preferred street shoes and LB clothing; driving, shopping, and home management . Lymphedema and  associated pain limits Pt's ability to perform productive activities and  leisure pursuits and to participate in socialization at home and in the community.  BLE lymphedema contributes to elevated infection risk and increased falls risk due to body asymmetry. Ms Gonzaga  will benefit from skilled OT for Complete Decongestive Therapy (CDT) the gold standard of lymphedema care, including manual lymphatic drainage (MLD), skin care to limit infection risk and increase skin excursion, lymphatic pumping exercise, and during the Intensive Phase multilayer, gradient compression bandaging to reduce limb volume. Once limb volume reduction reaches a clinical plateau custom compression garments that provide appropriate fit, compression and containment are fitted. Throughout the treatment course Pt/ caregiver will learn lymphedema prevention strategies and precautions, learn to perform all LE self-care home program components. Without skilled OT for lymphedema care, Pt's condition will progress and further functional decline is expected.)    OBJECTIVE IMPAIRMENTS: Abnormal gait, decreased activity tolerance, decreased balance, decreased knowledge of condition, decreased knowledge of use of DME, decreased mobility, difficulty walking, decreased ROM, decreased strength, increased edema, impaired flexibility, impaired sensation, improper body mechanics, obesity, pain, and chronic , progressive, BLE swelling with related tissue changes and thickening, increased infection risk, non-healing wound.   ACTIVITY LIMITATIONS: Difficulty performing basic and instrumental ADLs (LB dressing, LB bathing,bathing, toileting, dressing, hygiene/grooming, and functional ambulation. Difficulty performing leisure pursuits and productive activities requiring standing and/ or walking for > 19 minutes  PARTICIPATION LIMITATIONS:  Impaired social participation, decreased participation in church  PERSONAL FACTORS: Behavior pattern, Fitness,  Past/current experiences, Social background, Time since onset of injury/illness/exacerbation, and 3+ comorbidities: OA, hx R ankle fracture and non-healing wound, hx plantar fasciitis are also affecting patient's functional outcome.   REHAB POTENTIAL: Good  CLINICAL DECISION MAKING: Evolving/moderate complexity  EVALUATION COMPLEXITY: Moderate   GOALS: Goals reviewed with patient? Yes  SHORT TERM GOALS: Target date: 4th OT Rx visit   Pt will demonstrate understanding of lymphedema precautions and prevention strategies with modified independence using a printed reference to identify at least 5 precautions and discussing how s/he may implement them into daily life to reduce risk of progression with extra time. Baseline:Max A Goal status: GOAL MET  2.  Pt will be able to apply multilayer, knee length, gradient, compression wraps to one leg at a time from toes to below knee with modified independence (extra time) to decrease limb volume, to limit infection risk, and to limit lymphedema progression.  Baseline: Dependent Goal status: GOAL MET  LONG TERM GOALS: Target date: 02/18/24  Given this patient's Intake score of 50 % on the functional outcomes FOTO tool, patient will experience an increase in function of 5 points to improve basic and instrumental ADLs performance, including lymphedema self-care.  Baseline: 50 Goal status: INITIAL. 12/25/23 GOAL DEFERRED. FOTO tool discontinued.  2.  Given this patient's Intake score of 47.06 % on the Lymphedema Life Impact Scale (LLIS), patient will experience a reduction of at least 5 points in her perceived level of functional impairment resulting from lymphedema to improve functional performance and quality of life (QOL). Baseline: 47.06 % Goal status: PROGRESSING  3.  Pt will achieve at least a 10% volume reduction in B legs to return limb to typical size and shape, to limit infection risk and LE progression, to decrease pain, to improve  function. Baseline: Dependent Goal status: Achieved w R LEG w/ overall reduction to date of 22.4%. 02/12/24 LLE decreased by 16.98%  date. GOAL MET  4.  Pt will obtain appropriate compression garments/devices and achieve modified independence (extra time + assistive devices) with donning/doffing to optimize limb volume reductions and limit LE progression over time. Baseline: Dependent Goal status: PARTIALLY MET . Fitted RLE garments last session.   5. During Intensive phase CDT , with modified  independence, Pt will achieve at least 85% compliance with all lymphedema self-care home program components, including daily skin care, compression wraps and /or garments, simple self MLD and lymphatic pumping therex to habituate LE self care protocol  into ADLs for optimal LE self-management over time. Baseline: Dependent Goal status:GOAL MET  PLAN:  OT FREQUENCY: 1 x/ every other week and PRN  OT DURATION: 12 weeks and PRN  PLANNED INTERVENTIONS: 97110-Therapeutic exercises, 97530- Therapeutic activity, 97140- Manual therapy, Dry Needling, Manual lymph drainage, and Compression bandaging Complete Decongestive Therapy: Manual lympathic drainage, skin care,    compression wraps,, then fit with appropriate compression garments during Self-management Phase.  PLAN FOR NEXT SESSION:  LLE MLD w simultaneous skin care Multilayer compression wraps Pt edu for lymphedema self-ca home program.  Arnold Bicker, MS, OTR/L, CLT-LANA 04/10/24 12:30 PM

## 2024-04-13 ENCOUNTER — Ambulatory Visit: Payer: Medicare PPO | Admitting: Occupational Therapy

## 2024-04-17 ENCOUNTER — Ambulatory Visit: Payer: Medicare PPO | Admitting: Occupational Therapy

## 2024-04-21 ENCOUNTER — Ambulatory Visit: Admitting: Occupational Therapy

## 2024-04-24 ENCOUNTER — Encounter: Payer: Self-pay | Admitting: Occupational Therapy

## 2024-04-24 ENCOUNTER — Ambulatory Visit: Admitting: Occupational Therapy

## 2024-04-24 DIAGNOSIS — I890 Lymphedema, not elsewhere classified: Secondary | ICD-10-CM | POA: Diagnosis not present

## 2024-04-24 NOTE — Therapy (Signed)
 OUTPATIENT OCCUPATIONAL THERAPY TREATMENT NOTE   BILATERAL LOWER EXTREMITY LYMPHEDEMA   Patient Name: Hayley Barr MRN: [id number] DOB:[birth year], 68 y.o., female Today's Date: 04/24/2024  REPORTING PERIOD: 01/03/24 - 02/17/24  END OF SESSION:   OT End of Session - 04/24/24 1007     Visit Number 29    Number of Visits 36    Date for OT Re-Evaluation 05/03/24    OT Start Time 1002    OT Stop Time 1102    OT Time Calculation (min) 60 min    Activity Tolerance Patient tolerated treatment well;No increased pain    Behavior During Therapy [facility] for tasks assessed/performed             History reviewed. No pertinent past medical history. History reviewed. No pertinent surgical history. Patient Active Problem List   Diagnosis Date Noted   Foot pain, right 05/15/2022   Benign essential hypertension 09/02/2020   Diabetes (HCC) 09/02/2020   Dyslipidemia, goal LDL below 100 09/02/2020   Esophageal reflux 09/02/2020   Irritable bowel syndrome without diarrhea 09/02/2020   Primary osteoarthritis of knees, bilateral 09/02/2020   Psoriasis 09/02/2020   Vitamin D deficiency 09/02/2020   Abscess and cellulitis 02/08/2015   Recurrent umbilical hernia 10/23/2014   Hair loss 09/10/2014   Ventral hernia 09/10/2014    PCP: Gilda Labor, MD  REFERRING PROVIDER: Jolene Nearing, MD  REFERRING DIAG: 189.0  THERAPY DIAG:  Lymphedema, not elsewhere classified  Rationale for Evaluation and Treatment: Rehabilitation  ONSET DATE: 1.5 yrs after an episode of shingles  SUBJECTIVE:                                                                                                                                                                                           SUBJECTIVE STATEMENT: Hayley Barr presents to Occupational Therapy for BLE lymphedema. Pt arrives seated in transport wc. She was last seen on 03/27/24 when visit frequency was decreased due to long wait from DME  vendor for custom compression garment and financial hardship resulting from delay. Pt states she is doing very well. She continues to wrap the L Leg and wear her custom garment on the R.  Pt denies LE lymphedema-related pain today.    PERTINENT HISTORY: HTN, OA, DM type 2, pre-existing B leg and foot lymphedema 2/2 suspected venous insufficiency, R>L. R leg Fx with reconstruction of Achilles;  chronic plantar  fasciitis, Negative for DVT in 1923, Hx R foot abscess and cellulitis, IBS, Psoriasis, bilateral neuropathy feet and legs, currently has PT 2 x weekly   PAIN:  Are you having pain? no, L leg and knee leg  pain not rated/10 Location: B legs, feet and ankles, R>L Description: heaviness, stiffness, tightness What makes it better? elevation What makes it worse? Standing, walking, dependent sitting  PRECAUTIONS: Fall Risk;  Infection Risk, LYMPHEDEMA precautions, DM type 2 precautions  WEIGHT BEARING RESTRICTIONS: No  FALLS:  Has patient fallen in last 6 months? Yes. 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  COGNITION:  Overall cognitive status: Within functional limits for tasks assessed   OBSERVATIONS / OTHER ASSESSMENTS:  Mild-moderate, BLE lymphedema 2/2 suspected venous insufficiency and obesity ( weight induced lymphedema.  POSTURE: WFL  LE ROM: WFL for  clinical tasks  LE MMT: WFL for clinical  task  LYMPHEDEMA ASSESSMENTS:   SURGERY TYPE/DATE: N/A, non cancer related  Hx INFECTIONS: NONE     Hx WOUNDS: currently has a dime sized non healing wound at base of R heel incision from achilles reconstruction   FOTO functional outcome measure: Initial 09/29/23: 50%- 02/04/24: FOTO Discontinued  Lymphedema Life Impact Scale (LLIS) Initial 09/29/23: 47.06%  BLE COMPARATIVE LIMB VOLUMETRICS:   11/04/23 Initial  LANDMARK RIGHT   R LEG (A-D) 6686.8 ml  R THIGH (E-G) ml  R FULL LIMB (A-G) ml  Limb Volume differential (LVD)  0.5 %, L>R  Volume change since initial %  Volume change overall V  (Blank rows = not tested)  LANDMARK LEFT   L LEG (A-D) 6743.7 ml  L THIGH (E-G) ml  L FULL LIMB (A-G) ml  Limb Volume differential (LVD)  %  Volume change since initial %  Volume change overall %   (Blank rows = not tested)  6th visit RLE 12/10/23  LANDMARK RIGHT   R LEG (A-D) 5451.6 ml  R THIGH (E-G) ml  R FULL LIMB (A-G) ml  Limb Volume differential (LVD)  %  Volume change since initial  DEC 18.47% since 11/04/23  Volume change overall V  (Blank rows = not tested)   9th visit RLE 12/24/23  [facility] RIGHT   R LEG (A-D) 5451.6 ml  R THIGH (E-G) ml  R FULL LIMB (A-G) ml  Limb Volume differential (LVD)  %  Volume change since initial DECREASED 22.4% overall  Volume change since 12/10/23 DECREASED 4.8%  (Blank rows = not tested)  LLE COMPARATIVE LIMB VOLUMETRICS:   02/12/24 19 th visit ( last measured LE at initial Rx session on 11/04/23)   LANDMARK LEFT   L LEG (A-D) 5598.5 ml  L THIGH (E-G) ml  L FULL LIMB (A-G) ml  Limb Volume differential (LVD)   7.8%, L>R%  Volume change since initial  DECREASED 16.98%%  Volume change overall %   (Blank rows = not tested)  GAIT: Distance walked: arrived in transport wc. Able to transfer to Rx bed and back to transport wc using Single point cane (modified independence)  TODAY'S TREATMENT:  LLE/LLQ MLD with simultaneous skin care .  Also performed fibrosis techniques to medial and lateral malleolus  Pt education re  LE self-care-ongoing Multilayer compression wraps  from toes to below knee on L  PATIENT EDUCATION:  Continued Pt/ CG edu for lymphedema self care home program throughout session. Topics include outcome of comparative limb volumetrics- starting limb volume differentials (LVDs), technology and gradient techniques used for short stretch, multilayer compression wrapping, simple self-MLD, therapeutic lymphatic pumping exercises, skin/nail care, LE precautions,. compression garment recommendations and specifications, wear and care schedule and compression garment donning / doffing w assistive devices. Discussed progress towards all OT goals since commencing CDT. All questions answered to the Pt's satisfaction. Good return. Person educated: Patient and family Education method: Explanation, Demonstration, and Handouts Education comprehension: verbalized understanding, returned demonstration, verbal cues required, and needs further education  HOME EXERCISE PROGRAM: BLE lymphatic pumping there ex using- 1 set of 10 reps, each exercise in order-  1-2 x daily, bilaterally Simple self MLD 1 x daily Daily skin care and inspection to increase hydration, skin mobility and decrease infection risk- can be done during MLD Compression wraps 23/7 until garment fitting complete (multilayer compression wraps to R LEG using 1 each of 8, 10, and 12 cm wide short stretch compression wraps over single layer of stockinett and Rosidal Soft foam ( 0.4 cm thick) . Finished wraps with stretch net to keep tape in place. )  ASSESSMENT: CLINICAL IMPRESSION: Pt tolerated LLE/LLQ MLD and multilayer compression wrapping without difficulty today. Limb volume reduction continues to decrease slowly. Skin condition cont to  improve.  Cont OT frequency recently decreased to 1 x weekly to conserve visits and finances.  Fit LLE custom garments and devices as soon as delivered, then transition to self-management mode of CDT.  Reducing OT frequency for LE care to 1 x every other week. Pt remains 100% compliant with LE self care home program and is able to manage well, I believe, with that interval while we're awaiting garment fitting. Pt agrees to call PRN. Cont as per this POC.  (10/29/23 INITIAL EVALUATION:  Hayley Barr is a 68 yo female presenting with mild, stage 2, BLE lymphedema, R>L.  Longstanding, mild, B ankle swelling is exacerbated for the past 1.5 years s/P an episode of shingles affecting the R leg, and a subsequent fall resulting in ankle fracture with Achilles reconstruction and ongoing, non-healing wound at the distal end of surgical scar. This situation is concerning re elevated infection risk from lymphatic dysfunction in the context of DM type II,  and ongoing functional decline due to associated problems with ambulation and functional mobility.  Lymphedema occurs secondary to orthopedic trauma when the injury damages lymphatic vessels and structures in the area, leading to a buildup of fluid carrying cellular debris, waste products, bacteria, viruses, and damaged or abnormal cells in the tissues causing protein-rich swelling in the affected body part. This secondary lymphedema, often arising from significant soft tissue trauma accompanying the fracture, can potentially delay wound healing and complicate fracture management.   Progressing BLE lymphedema limits Hayley Barr's functional performance in all occupational domains, including functional ambulation and transfers, standing tolerance, dependent sitting for more than 15 minutes,  basic and instrumental ADLs performance, including lower body dressing, LB bathing, fitting preferred street shoes and LB clothing; driving, shopping, and home management .  Lymphedema and associated pain limits Pt's ability to perform productive activities and  leisure pursuits and to participate in socialization at home and in the community.  BLE lymphedema contributes to elevated infection risk and increased falls risk due to body asymmetry. Hayley Barr  will benefit from skilled OT for Complete Decongestive Therapy (CDT) the gold standard of lymphedema care, including manual lymphatic drainage (MLD), skin care to limit infection risk and increase skin excursion, lymphatic pumping exercise, and during the Intensive Phase multilayer, gradient compression bandaging to reduce limb volume. Once limb volume reduction reaches a clinical plateau custom compression garments that provide appropriate fit, compression and containment are fitted. Throughout the treatment course Pt/ caregiver will learn lymphedema prevention strategies and precautions, learn to perform all LE self-care home program components. Without skilled OT for lymphedema care, Pt's condition will progress and further functional decline is expected.)    OBJECTIVE IMPAIRMENTS: Abnormal gait, decreased activity tolerance, decreased balance, decreased knowledge of condition, decreased knowledge of use of DME, decreased mobility, difficulty walking, decreased ROM, decreased strength, increased edema, impaired flexibility, impaired sensation, improper body mechanics, obesity, pain, and chronic , progressive, BLE swelling with related tissue changes and thickening, increased infection risk, non-healing wound.   ACTIVITY LIMITATIONS: Difficulty performing basic and instrumental ADLs (LB dressing, LB bathing,bathing, toileting, dressing, hygiene/grooming, and functional ambulation. Difficulty performing leisure pursuits and productive activities requiring standing and/ or walking for > 19 minutes  PARTICIPATION LIMITATIONS:  Impaired social participation, decreased participation in church  PERSONAL FACTORS: Behavior  pattern, Fitness, Past/current experiences, Social background, Time since onset of injury/illness/exacerbation, and 3+ comorbidities: OA, hx R ankle fracture and non-healing wound, hx plantar fasciitis are also affecting patient's functional outcome.   REHAB POTENTIAL: Good  CLINICAL DECISION MAKING: Evolving/moderate complexity  EVALUATION COMPLEXITY: Moderate   GOALS: Goals reviewed with patient? Yes  SHORT TERM GOALS: Target date: 4th OT Rx visit   Pt will demonstrate understanding of lymphedema precautions and prevention strategies with modified independence using a printed reference to identify at least 5 precautions and discussing how s/he may implement them into daily life to reduce risk of progression with extra time. Baseline:Max A Goal status: GOAL MET  2.  Pt will be able to apply multilayer, knee length, gradient, compression wraps to one leg at a time from toes to below knee with modified independence (extra time) to decrease limb volume, to limit infection risk, and to limit lymphedema progression.  Baseline: Dependent Goal status: GOAL MET  LONG TERM GOALS: Target date: 02/18/24  Given this patient's Intake score of 50 % on the functional outcomes FOTO tool, patient will experience an increase in function of 5 points to improve basic and instrumental ADLs performance, including lymphedema self-care.  Baseline: 50 Goal status: INITIAL. 12/25/23 GOAL DEFERRED. FOTO tool discontinued.  2.  Given this patient's Intake score of 47.06 % on the Lymphedema Life Impact Scale (LLIS), patient will experience a reduction of at least 5 points in her perceived level of functional impairment resulting from lymphedema to improve functional performance and quality of life (QOL). Baseline: 47.06 % Goal status: PROGRESSING  3.  Pt will achieve at least a 10% volume reduction in B legs to return limb to typical size and shape, to limit infection risk and LE progression, to decrease pain, to  improve function. Baseline: Dependent Goal status: Achieved w R LEG w/ overall reduction to date of 22.4%. 02/12/24 LLE decreased by 16.98%  date. GOAL MET  4.  Pt will obtain appropriate compression garments/devices and achieve modified independence (extra time + assistive devices) with donning/doffing to optimize limb volume reductions and limit LE progression over time. Baseline: Dependent Goal status: PARTIALLY MET . Fitted RLE garments last session.   5. During Intensive phase CDT , with modified  independence, Pt will achieve at least 85% compliance with all lymphedema self-care home program components, including daily skin care, compression wraps and /or garments, simple self MLD and lymphatic pumping therex to habituate LE self care protocol  into ADLs for optimal LE self-management over time. Baseline: Dependent Goal status:GOAL MET  PLAN:  OT FREQUENCY: 1 x/ every other week and PRN  OT DURATION: 12 weeks and PRN  PLANNED INTERVENTIONS: 97110-Therapeutic exercises, 97530- Therapeutic activity, 97140- Manual therapy, Dry Needling, Manual lymph drainage, and Compression bandaging Complete Decongestive Therapy: Manual lympathic drainage, skin care,    compression wraps,, then fit with appropriate compression garments during Self-management Phase.  PLAN FOR NEXT SESSION:  LLE MLD w simultaneous skin care Multilayer compression wraps Pt edu for lymphedema self-ca home program.  Arnold Bicker, Hayley, OTR/L, CLT-LANA 04/24/24 11:11 AM

## 2024-04-28 ENCOUNTER — Ambulatory Visit: Admitting: Occupational Therapy

## 2024-05-01 ENCOUNTER — Encounter: Admitting: Occupational Therapy

## 2024-05-05 ENCOUNTER — Encounter: Admitting: Occupational Therapy

## 2024-05-08 ENCOUNTER — Encounter: Admitting: Occupational Therapy

## 2024-05-12 ENCOUNTER — Encounter: Admitting: Occupational Therapy

## 2024-05-15 ENCOUNTER — Ambulatory Visit: Attending: Cardiothoracic Surgery | Admitting: Occupational Therapy

## 2024-05-15 DIAGNOSIS — I890 Lymphedema, not elsewhere classified: Secondary | ICD-10-CM | POA: Diagnosis present

## 2024-05-15 NOTE — Therapy (Signed)
 OUTPATIENT OCCUPATIONAL THERAPY TREATMENT NOTE AND PROGRESS REPORT  BILATERAL LOWER EXTREMITY LYMPHEDEMA   Patient Name: Hayley Barr MRN: [id number] DOB:07/06/1956, 68 y.o., female Today's Date: 05/15/2024  REPORTING PERIOD: 02/24/24 - 05/15/24  END OF SESSION:   OT End of Session - 05/15/24 1205     Visit Number 30    Number of Visits 36    Date for OT Re-Evaluation 08/13/24    OT Start Time 1000    OT Stop Time 1100    OT Time Calculation (min) 60 min    Activity Tolerance Patient tolerated treatment well;No increased pain    Behavior During Therapy [facility] for tasks assessed/performed          No past medical history on file. No past surgical history on file. Patient Active Problem List   Diagnosis Date Noted   Foot pain, right 05/15/2022   Benign essential hypertension 09/02/2020   Diabetes (HCC) 09/02/2020   Dyslipidemia, goal LDL below 100 09/02/2020   Esophageal reflux 09/02/2020   Irritable bowel syndrome without diarrhea 09/02/2020   Primary osteoarthritis of knees, bilateral 09/02/2020   Psoriasis 09/02/2020   Vitamin D deficiency 09/02/2020   Abscess and cellulitis 02/08/2015   Recurrent umbilical hernia 10/23/2014   Hair loss 09/10/2014   Ventral hernia 09/10/2014    PCP: Gilda Labor, MD  REFERRING PROVIDER: Jolene Nearing, MD  REFERRING DIAG: 189.0  THERAPY DIAG:  Lymphedema, not elsewhere classified  Rationale for Evaluation and Treatment: Rehabilitation  ONSET DATE: 1.5 yrs after an episode of shingles  SUBJECTIVE:                                                                                                                                                                                           SUBJECTIVE STATEMENT: Hayley Barr presents to Occupational Therapy for BLE lymphedema. Pt arrives seated in transport wc WEARING BILATERAL LOWER EXTREMITY CUSTOM COMPRESSION KNEE HIGHS. Aaron Aas She was last seen on 04/24/24 when visit  frequency . Pt states she is doing very well. She states new compression stocking fits well and is very comfortable. Pt denies LE lymphedema-related pain today.    PERTINENT HISTORY: HTN, OA, DM type 2, pre-existing B leg and foot lymphedema 2/2 suspected venous insufficiency, R>L. R leg Fx with reconstruction of Achilles;  chronic plantar  fasciitis, Negative for DVT in 1923, Hx R foot abscess and cellulitis, IBS, Psoriasis, bilateral neuropathy feet and legs, currently has PT 2 x weekly   PAIN:  Are you having pain? no, L leg and knee leg pain not rated/10 Location: B legs, feet and ankles, R>L Description: heaviness, stiffness, tightness What makes  it better? elevation What makes it worse? Standing, walking, dependent sitting  PRECAUTIONS: Fall Risk;  Infection Risk, LYMPHEDEMA precautions, DM type 2 precautions  WEIGHT BEARING RESTRICTIONS: No  FALLS:  Has patient fallen in last 6 months? Yes. 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  COGNITION:  Overall cognitive status: Within functional limits for tasks assessed   OBSERVATIONS / OTHER ASSESSMENTS:  Mild-moderate, BLE lymphedema 2/2 suspected venous insufficiency and obesity ( weight induced lymphedema.  POSTURE: WFL  LE ROM: WFL for  clinical tasks  LE MMT: WFL for clinical task  LYMPHEDEMA ASSESSMENTS:   SURGERY TYPE/DATE: N/A, non cancer  related  Hx INFECTIONS: NONE     Hx WOUNDS: currently has a dime sized non healing wound at base of R heel incision from achilles reconstruction   FOTO functional outcome measure: Initial 09/29/23: 50%- 02/04/24: FOTO Discontinued  Lymphedema Life Impact Scale (LLIS) Initial 09/29/23: 47.06%  BLE COMPARATIVE LIMB VOLUMETRICS:   11/04/23 Initial  LANDMARK RIGHT   R LEG (A-D) 6686.8 ml  R THIGH (E-G) ml  R FULL LIMB (A-G) ml  Limb Volume differential (LVD)  0.5 %, L>R  Volume change since initial %  Volume change overall V  (Blank rows = not tested)  LANDMARK LEFT   L LEG (A-D) 6743.7 ml  L THIGH (E-G) ml  L FULL LIMB (A-G) ml  Limb Volume differential (LVD)  %  Volume change since initial %  Volume change overall %   (Blank rows = not tested)  6th visit RLE 12/10/23  LANDMARK RIGHT   R LEG (A-D) 5451.6 ml  R THIGH (E-G) ml  R FULL LIMB (A-G) ml  Limb Volume differential (LVD)  %  Volume change since initial  DEC 18.47% since 11/04/23  Volume change overall V  (Blank rows = not tested)   9th visit RLE 12/24/23  [facility] RIGHT   R LEG (A-D) 5451.6 ml  R THIGH (E-G) ml  R FULL LIMB (A-G) ml  Limb Volume differential (LVD)  %  Volume change since initial DECREASED 22.4% overall  Volume change since 12/10/23 DECREASED 4.8%  (Blank rows = not tested)  LLE COMPARATIVE LIMB VOLUMETRICS:   02/12/24 19 th visit ( last measured LE at initial Rx session on 11/04/23)   LANDMARK LEFT   L LEG (A-D) 5598.5 ml  L THIGH (E-G) ml  L FULL LIMB (A-G) ml  Limb Volume differential (LVD)   7.8%, L>R%  Volume change since initial  DECREASED 16.98%  Volume change overall %   (Blank rows = not tested)  GAIT: Distance walked: arrived in transport wc. Able to transfer to Rx bed and back to transport wc using Single point cane (modified independence)  TODAY'S TREATMENT:   Assessment of custom compression stocking fit and function  Pt education re  LE self-care-ongoing and progress to date   PATIENT EDUCATION:  Continued Pt/ CG edu for lymphedema self care home program throughout session. Topics include outcome of comparative limb volumetrics- starting limb volume differentials (LVDs), technology and gradient techniques used for short stretch, multilayer compression wrapping, simple self-MLD, therapeutic lymphatic pumping exercises, skin/nail care, LE precautions,. compression garment recommendations and specifications, wear and care schedule and compression garment donning / doffing w assistive devices. Discussed progress towards all OT goals since commencing CDT. All questions answered to the Pt's satisfaction. Good return. Person educated: Patient and family Education method: Explanation, Demonstration, and Handouts Education comprehension: verbalized understanding, returned demonstration, verbal cues required, and needs further education  HOME EXERCISE PROGRAM: BLE lymphatic pumping there ex using- 1 set of 10 reps, each exercise in order-  1-2 x daily, bilaterally Simple self MLD 1 x daily Daily skin care and inspection to increase hydration, skin mobility and decrease infection risk- can be done during MLD Compression wraps 23/7 until garment fitting complete (multilayer compression wraps to R LEG using 1 each of 8, 10, and 12 cm wide short stretch compression wraps over single layer of stockinett and Rosidal Soft foam ( 0.4 cm thick) . Finished wraps with stretch net to keep tape in place. )  ASSESSMENT: CLINICAL IMPRESSION: Pt returns today wearing custom LLE compression stocking that she finally received earlier this week.  Garment appears to fit very well, and Pt is pleased w comfort. Garment appears to be controlling swelling appropriately. Pt is able to don and doff. Pt has achieved all OT goals for lymphedema care. We forgot to assess  final score   on LLIS, but will do so at 3 mo follow up. Pt has excellent outcome with reduced swelling, improved skin condition , reduced infection risk, healed open foot wound, and improved functional performance in all occupational domains. We'll see Hayley Barr back for follow up in 3 months, and PRN.   (10/29/23 INITIAL EVALUATION:  Eleonore Boerema is a 68 yo female presenting with mild, stage 2, BLE lymphedema, R>L.  Longstanding, mild, B ankle swelling is exacerbated for the past 1.5 years s/P an episode of shingles affecting the R leg, and a subsequent fall resulting in ankle fracture with Achilles reconstruction and ongoing, non-healing wound at the distal end of surgical scar. This situation is concerning re elevated infection risk from lymphatic dysfunction in the context of DM type II,  and ongoing functional decline due to associated problems with ambulation and functional mobility.  Lymphedema occurs secondary to orthopedic trauma when the injury damages lymphatic vessels and structures in the area, leading to a buildup of fluid carrying cellular debris, waste products, bacteria, viruses, and damaged or abnormal cells in the tissues causing protein-rich swelling in the affected body part. This secondary lymphedema, often arising from significant soft tissue trauma accompanying the fracture, can potentially delay wound healing and complicate fracture management.   Progressing BLE lymphedema limits Hayley Goodspeed's functional performance in all occupational domains, including functional ambulation and transfers, standing tolerance, dependent sitting for more than 15 minutes,  basic and instrumental ADLs performance, including lower body dressing, LB bathing, fitting preferred street shoes and LB clothing; driving, shopping, and home management . Lymphedema and associated pain limits Pt's ability to perform productive activities and  leisure pursuits and to participate in socialization at home and  in the community.  BLE lymphedema contributes to elevated infection risk and increased falls risk due to body asymmetry. Hayley Mefferd will  benefit from skilled OT for Complete Decongestive Therapy (CDT) the gold standard of lymphedema care, including manual lymphatic drainage (MLD), skin care to limit infection risk and increase skin excursion, lymphatic pumping exercise, and during the Intensive Phase multilayer, gradient compression bandaging to reduce limb volume. Once limb volume reduction reaches a clinical plateau custom compression garments that provide appropriate fit, compression and containment are fitted. Throughout the treatment course Pt/ caregiver will learn lymphedema prevention strategies and precautions, learn to perform all LE self-care home program components. Without skilled OT for lymphedema care, Pt's condition will progress and further functional decline is expected.)    OBJECTIVE IMPAIRMENTS: Abnormal gait, decreased activity tolerance, decreased balance, decreased knowledge of condition, decreased knowledge of use of DME, decreased mobility, difficulty walking, decreased ROM, decreased strength, increased edema, impaired flexibility, impaired sensation, improper body mechanics, obesity, pain, and chronic , progressive, BLE swelling with related tissue changes and thickening, increased infection risk, non-healing wound.   ACTIVITY LIMITATIONS: Difficulty performing basic and instrumental ADLs (LB dressing, LB bathing,bathing, toileting, dressing, hygiene/grooming, and functional ambulation. Difficulty performing leisure pursuits and productive activities requiring standing and/ or walking for > 19 minutes  PARTICIPATION LIMITATIONS:  Impaired social participation, decreased participation in church  PERSONAL FACTORS: Behavior pattern, Fitness, Past/current experiences, Social background, Time since onset of injury/illness/exacerbation, and 3+ comorbidities: OA, hx R ankle fracture  and non-healing wound, hx plantar fasciitis are also affecting patient's functional outcome.   REHAB POTENTIAL: Good  CLINICAL DECISION MAKING: Evolving/moderate complexity  EVALUATION COMPLEXITY: Moderate   GOALS: Goals reviewed with patient? Yes  SHORT TERM GOALS: Target date: 4th OT Rx visit   Pt will demonstrate understanding of lymphedema precautions and prevention strategies with modified independence using a printed reference to identify at least 5 precautions and discussing how s/he may implement them into daily life to reduce risk of progression with extra time. Baseline:Max A Goal status: GOAL MET  2.  Pt will be able to apply multilayer, knee length, gradient, compression wraps to one leg at a time from toes to below knee with modified independence (extra time) to decrease limb volume, to limit infection risk, and to limit lymphedema progression.  Baseline: Dependent Goal status: GOAL MET  LONG TERM GOALS: Target date: 02/18/24  Given this patient's Intake score of 50 % on the functional outcomes FOTO tool, patient will experience an increase in function of 5 points to improve basic and instrumental ADLs performance, including lymphedema self-care.  Baseline: 50 Goal status: INITIAL. 12/25/23 GOAL DEFERRED. FOTO tool discontinued.  2.  Given this patient's Intake score of 47.06 % on the Lymphedema Life Impact Scale (LLIS), patient will experience a reduction of at least 5 points in her perceived level of functional impairment resulting from lymphedema to improve functional performance and quality of life (QOL). Baseline: 47.06 % Goal status: PROGRESSING  3.  Pt will achieve at least a 10% volume reduction in B legs to return limb to typical size and shape, to limit infection risk and LE progression, to decrease pain, to improve function. Baseline: Dependent Goal status: Achieved w R LEG w/ overall reduction to date of 22.4%. 02/12/24 LLE decreased by 16.98%. GOAL MET  4.   Pt will obtain appropriate compression garments/devices and achieve modified independence (extra time + assistive devices) with donning/doffing to optimize limb volume reductions and limit LE progression over time. Baseline: Dependent Goal status: GOAL MET  5. During Intensive phase CDT , with modified independence, Pt will achieve at least 85% compliance with all  lymphedema self-care home program components, including daily skin care, compression wraps and /or garments, simple self MLD and lymphatic pumping therex to habituate LE self care protocol  into ADLs for optimal LE self-management over time. Baseline: Dependent Goal status:GOAL MET  PLAN:  OT FREQUENCY: 3 month f/u and PRN  OT DURATION: 12 weeks and PRN  PLANNED INTERVENTIONS: 97110-Therapeutic exercises, 97530- Therapeutic activity, 97140- Manual therapy, Dry Needling, Manual lymph drainage, and Compression bandaging Complete Decongestive Therapy: Manual lympathic drainage, skin care,    compression wraps,, then fit with appropriate compression garments during Self-management Phase.  PLAN FOR NEXT SESSION:  Check limb volumetrics Check garment condition Check skin condition Pt edu  Arnold Bicker, Hayley, OTR/L, CLT-LANA 05/15/24 12:07 PM

## 2024-08-14 ENCOUNTER — Ambulatory Visit: Attending: Cardiothoracic Surgery | Admitting: Occupational Therapy

## 2024-08-14 DIAGNOSIS — I890 Lymphedema, not elsewhere classified: Secondary | ICD-10-CM | POA: Insufficient documentation

## 2024-08-14 NOTE — Therapy (Signed)
 OUTPATIENT OCCUPATIONAL THERAPY TREATMENT NOTE   BILATERAL LOWER EXTREMITY LYMPHEDEMA   Patient Name: Hayley Barr MRN: [id number] DOB:[birth year], 68 y.o., female Today's Date: 08/14/2024  REPORTING PERIOD:   END OF SESSION:   OT End of Session - 08/14/24 1104     Visit Number 31    Number of Visits 36    Date for Recertification  08/13/24    OT Start Time 1000    OT Stop Time 1100    OT Time Calculation (min) 60 min    Activity Tolerance Patient tolerated treatment well;No increased pain    Behavior During Therapy [facility] for tasks assessed/performed          No past medical history on file. No past surgical history on file. Patient Active Problem List   Diagnosis Date Noted   Foot pain, right 05/15/2022   Benign essential hypertension 09/02/2020   Diabetes (HCC) 09/02/2020   Dyslipidemia, goal LDL below 100 09/02/2020   Esophageal reflux 09/02/2020   Irritable bowel syndrome without diarrhea 09/02/2020   Primary osteoarthritis of knees, bilateral 09/02/2020   Psoriasis 09/02/2020   Vitamin D deficiency 09/02/2020   Abscess and cellulitis 02/08/2015   Recurrent umbilical hernia 10/23/2014   Hair loss 09/10/2014   Ventral hernia 09/10/2014    PCP: Dorenda Mormon, MD  REFERRING PROVIDER: Bert Damita Skene, MD  REFERRING DIAG: 189.0  THERAPY DIAG:  Lymphedema, not elsewhere classified  Rationale for Evaluation and Treatment: Rehabilitation  ONSET DATE: 1.5 yrs after an episode of shingles  SUBJECTIVE:                                                                                                                                                                                           SUBJECTIVE STATEMENT: Ms Schommer presents to Occupational Therapy for follow along to support self-management phase of CDT for BLE lymphedema. Pt walks to clinic using a single point cane instead of using a transport manual wc. Pt is wearing bilateral lower extremity,  custom, Elvarex compression knee highs. She was last seen on 05/15/24. Pt states she is doing very well. Pt denies LE lymphedema-related pain today. She states she feels her garments are worn out and need to be replaced.   PERTINENT HISTORY: HTN, OA, DM type 2, pre-existing B leg and foot lymphedema 2/2 suspected venous insufficiency, R>L. R leg Fx with reconstruction of Achilles;  chronic plantar  fasciitis, Negative for DVT in 1923, Hx R foot abscess and cellulitis, IBS, Psoriasis, bilateral neuropathy feet and legs, currently has PT 2 x weekly   PAIN:  Are you having pain? no, L leg and knee leg pain  not rated/10 Location: B legs, feet and ankles, R>L Description: heaviness, stiffness, tightness What makes it better? elevation What makes it worse? Standing, walking, dependent sitting  PRECAUTIONS: Fall Risk;  Infection Risk, LYMPHEDEMA precautions, DM type 2 precautions  WEIGHT BEARING RESTRICTIONS: No  FALLS:  Has patient fallen in last 6 months? Yes. 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  COGNITION:  Overall cognitive status: Within functional limits for tasks assessed   OBSERVATIONS / OTHER ASSESSMENTS:  Mild-moderate, BLE lymphedema 2/2 suspected venous insufficiency and obesity ( weight induced lymphedema.  POSTURE: WFL  LE ROM: WFL for  clinical tasks  LE MMT: WFL for clinical  task  LYMPHEDEMA ASSESSMENTS:   SURGERY TYPE/DATE: N/A, non cancer related  Hx INFECTIONS: NONE     Hx WOUNDS: currently has a dime sized non healing wound at base of R heel incision from achilles reconstruction   FOTO functional outcome measure: Initial 09/29/23: 50%- 02/04/24: FOTO Discontinued  Lymphedema Life Impact Scale (LLIS) Initial 09/29/23: 47.06%  BLE COMPARATIVE LIMB VOLUMETRICS:   11/04/23 Initial  LANDMARK RIGHT   R LEG (A-D) 6686.8 ml  R THIGH (E-G) ml  R FULL LIMB (A-G) ml  Limb Volume differential (LVD)  0.5 %, L>R  Volume change since initial %  Volume change overall V  (Blank rows = not tested)  LANDMARK LEFT   L LEG (A-D) 6743.7 ml  L THIGH (E-G) ml  L FULL LIMB (A-G) ml  Limb Volume differential (LVD)  %  Volume change since initial %  Volume change overall %   (Blank rows = not tested)  6th visit RLE 12/10/23  LANDMARK RIGHT   R LEG (A-D) 5451.6 ml  R THIGH (E-G) ml  R FULL LIMB (A-G) ml  Limb Volume differential (LVD)  %  Volume change since initial  DEC 18.47% since 11/04/23  Volume change overall V  (Blank rows = not tested)   9th visit RLE 12/24/23  [facility] RIGHT   R LEG (A-D) 5451.6 ml  R THIGH (E-G) ml  R FULL LIMB (A-G) ml  Limb Volume differential (LVD)  %  Volume change since initial DECREASED 22.4% overall  Volume change since 12/10/23 DECREASED 4.8%  (Blank rows = not tested)  LLE COMPARATIVE LIMB VOLUMETRICS:   02/12/24 19 th visit ( last measured LE at initial Rx session on 11/04/23)   LANDMARK LEFT   L LEG (A-D) 5598.5 ml  L THIGH (E-G) ml  L FULL LIMB (A-G) ml  Limb Volume differential (LVD)   7.8%, L>R%  Volume change since initial  DECREASED 16.98%  Volume change overall %   (Blank rows = not tested)  GAIT: Distance walked: arrived in transport wc. Able to transfer to Rx bed and back to transport wc using Single point cane (modified independence)  TODAY'S TREATMENT:   Assessment of custom compression  stockings fit and function  Pt education re  LE self-care-ongoing and progress to date   PATIENT EDUCATION:  Continued Pt/ CG edu for lymphedema self care home program throughout session. Topics include outcome of comparative limb volumetrics- starting limb volume differentials (LVDs), technology and gradient techniques used for short stretch, multilayer compression wrapping, simple self-MLD, therapeutic lymphatic pumping exercises, skin/nail care, LE precautions,. compression garment recommendations and specifications, wear and care schedule and compression garment donning / doffing w assistive devices. Discussed progress towards all OT goals since commencing CDT. All questions answered to the Pt's satisfaction. Good return. Person educated: Patient and family Education method: Explanation, Demonstration, and Handouts Education comprehension: verbalized understanding, returned demonstration, verbal cues required, and needs further education  HOME EXERCISE PROGRAM: BLE lymphatic pumping there ex using- 1 set of 10 reps, each exercise in order-  1-2 x daily, bilaterally Simple self MLD 1 x daily Daily skin care and inspection to increase hydration, skin mobility and decrease infection risk- can be done during MLD Compression wraps 23/7 until garment fitting complete (multilayer compression wraps to R LEG using 1 each of 8, 10, and 12 cm wide short stretch compression wraps over single layer of stockinett and Rosidal Soft foam ( 0.4 cm thick) . Finished wraps with stretch net to keep tape in place. )  ASSESSMENT: CLINICAL IMPRESSION: Pt returns today  and demonstrates excellent progress towards long term goals. She is ambulating instead of using a wc, and she has resumed working on a limited basis. These improvements in functional performance are exciting as they have  evolved over time and are very meaningful to Pt for QOL. Limb swelling bilaterally below the knees  is very well controlled. Skin condition is excellent. Neuropathic pain and hypersensitivity persist. Wound is closed. Existing custom, BLE Elvarex compression stockings are well worn out and are in need of replacement to continue optimal lymphedema management. Pt will call and schedule fitting once replacement garments are delivered.  Replace worn out, existing, custom, flat knit, ccl 2 ( 23-32 mmHg), Jobst, Elvarex Classic, knee length compression stockings with like garments. Ensure garments have slant open toe, T-heel, 2.5 cm wide silicone top band, oblique top edge, and tricot pocket sewn on all for sides at anterior ankle to limit skin irritation.   Custom-made gradient compression garments and HOS devices are medically necessary because they are uniquely sized and shaped to fit the exact dimensions of the affected extremities, and to provide appropriate medical grade, graduated compression essential for optimally managing chronic, progressive lymphedema. Multiple custom compression garments are needed to ensure proper hygiene to limit infection risk. Custom compression garments should be replaced q 3-6 months When worn consistently for optimal lymphedema self-management over time. HOS devices, medically necessary to limit fibrosis buildup in tissue, should be replaced q 2 years and PRN when worn out.      (10/29/23 INITIAL EVALUATION:  Ajane Nevitt is a 68 yo female presenting with mild, stage 2, BLE lymphedema, R>L.  Longstanding, mild, B ankle swelling is exacerbated for the past 1.5 years s/P an episode of shingles affecting the R leg, and a subsequent fall resulting in ankle fracture with Achilles reconstruction and ongoing, non-healing wound at the distal end of surgical scar. This situation is concerning re elevated infection risk from lymphatic dysfunction in the context of DM type II,  and  ongoing functional decline due to associated problems with ambulation and functional mobility.  Lymphedema occurs secondary to orthopedic trauma when the injury damages lymphatic vessels and structures in the area,  leading to a buildup of fluid carrying cellular debris, waste products, bacteria, viruses, and damaged or abnormal cells in the tissues causing protein-rich swelling in the affected body part. This secondary lymphedema, often arising from significant soft tissue trauma accompanying the fracture, can potentially delay wound healing and complicate fracture management.   Progressing BLE lymphedema limits Ms Lopes's functional performance in all occupational domains, including functional ambulation and transfers, standing tolerance, dependent sitting for more than 15 minutes,  basic and instrumental ADLs performance, including lower body dressing, LB bathing, fitting preferred street shoes and LB clothing; driving, shopping, and home management . Lymphedema and associated pain limits Pt's ability to perform productive activities and  leisure pursuits and to participate in socialization at home and in the community.  BLE lymphedema contributes to elevated infection risk and increased falls risk due to body asymmetry. Ms Hartnett will benefit from skilled OT for Complete Decongestive Therapy (CDT) the gold standard of lymphedema care, including manual lymphatic drainage (MLD), skin care to limit infection risk and increase skin excursion, lymphatic pumping exercise, and during the Intensive Phase multilayer, gradient compression bandaging to reduce limb volume. Once limb volume reduction reaches a clinical plateau custom compression garments that provide appropriate fit, compression and containment are fitted. Throughout the treatment course Pt/ caregiver will learn lymphedema prevention strategies and precautions, learn to perform all LE self-care home program components. Without skilled OT for  lymphedema care, Pt's condition will progress and further functional decline is expected.)    OBJECTIVE IMPAIRMENTS: Abnormal gait, decreased activity tolerance, decreased balance, decreased knowledge of condition, decreased knowledge of use of DME, decreased mobility, difficulty walking, decreased ROM, decreased strength, increased edema, impaired flexibility, impaired sensation, improper body mechanics, obesity, pain, and chronic , progressive, BLE swelling with related tissue changes and thickening, increased infection risk, non-healing wound.   ACTIVITY LIMITATIONS: Difficulty performing basic and instrumental ADLs (LB dressing, LB bathing,bathing, toileting, dressing, hygiene/grooming, and functional ambulation. Difficulty performing leisure pursuits and productive activities requiring standing and/ or walking for > 19 minutes  PARTICIPATION LIMITATIONS:  Impaired social participation, decreased participation in church  PERSONAL FACTORS: Behavior pattern, Fitness, Past/current experiences, Social background, Time since onset of injury/illness/exacerbation, and 3+ comorbidities: OA, hx R ankle fracture and non-healing wound, hx plantar fasciitis are also affecting patient's functional outcome.   REHAB POTENTIAL: Good  CLINICAL DECISION MAKING: Evolving/moderate complexity  EVALUATION COMPLEXITY: Moderate   GOALS: Goals reviewed with patient? Yes  SHORT TERM GOALS: Target date: 4th OT Rx visit   Pt will demonstrate understanding of lymphedema precautions and prevention strategies with modified independence using a printed reference to identify at least 5 precautions and discussing how s/he may implement them into daily life to reduce risk of progression with extra time. Baseline:Max A Goal status: GOAL MET  2.  Pt will be able to apply multilayer, knee length, gradient, compression wraps to one leg at a time from toes to below knee with modified independence (extra time) to decrease  limb volume, to limit infection risk, and to limit lymphedema progression.  Baseline: Dependent Goal status: GOAL MET  LONG TERM GOALS: Target date: 02/18/24  Given this patient's Intake score of 50 % on the functional outcomes FOTO tool, patient will experience an increase in function of 5 points to improve basic and instrumental ADLs performance, including lymphedema self-care.  Baseline: 50 Goal status: INITIAL. 12/25/23 GOAL DEFERRED. FOTO tool discontinued.  2.  Given this patient's Intake score of 47.06 % on the Lymphedema Life Impact Scale (LLIS),  patient will experience a reduction of at least 5 points in her perceived level of functional impairment resulting from lymphedema to improve functional performance and quality of life (QOL). Baseline: 47.06 % Goal status: PROGRESSING  3.  Pt will achieve at least a 10% volume reduction in B legs to return limb to typical size and shape, to limit infection risk and LE progression, to decrease pain, to improve function. Baseline: Dependent Goal status: Achieved w R LEG w/ overall reduction to date of 22.4%. 02/12/24 LLE decreased by 16.98%. GOAL MET  4.  Pt will obtain appropriate compression garments/devices and achieve modified independence (extra time + assistive devices) with donning/doffing to optimize limb volume reductions and limit LE progression over time. Baseline: Dependent Goal status: GOAL MET  5. During Intensive phase CDT , with modified independence, Pt will achieve at least 85% compliance with all lymphedema self-care home program components, including daily skin care, compression wraps and /or garments, simple self MLD and lymphatic pumping therex to habituate LE self care protocol  into ADLs for optimal LE self-management over time. Baseline: Dependent Goal status:GOAL MET  PLAN:  OT FREQUENCY: 3 month f/u and PRN  OT DURATION: 12 weeks and PRN  PLANNED INTERVENTIONS: 97110-Therapeutic exercises, 97530- Therapeutic  activity, 97140- Manual therapy, Dry Needling, Manual lymph drainage, and Compression bandaging Complete Decongestive Therapy: Manual lympathic drainage, skin care,    compression wraps,, then fit with appropriate compression garments during Self-management Phase.  PLAN FOR NEXT SESSION:  Check limb volumetrics Check garment condition Check skin condition Pt edu  Zebedee Dec, MS, OTR/L, CLT-LANA 08/14/24 11:21 AM

## 2024-09-14 ENCOUNTER — Ambulatory Visit: Attending: Cardiothoracic Surgery | Admitting: Occupational Therapy

## 2024-09-14 DIAGNOSIS — I890 Lymphedema, not elsewhere classified: Secondary | ICD-10-CM | POA: Insufficient documentation

## 2024-09-14 NOTE — Therapy (Signed)
 OUTPATIENT OCCUPATIONAL THERAPY TREATMENT NOTE   BILATERAL LOWER EXTREMITY LYMPHEDEMA   Patient Name: Hayley Barr MRN: [id number] DOB:24-Oct-1956, 68 y.o., female Today's Date: 09/14/2024  REPORTING PERIOD:   END OF SESSION:   OT End of Session - 09/14/24 1110     Visit Number 32    Number of Visits 36    Date for Recertification  12/13/24    OT Start Time 1105    Activity Tolerance Patient tolerated treatment well;No increased pain    Behavior During Therapy [facility] for tasks assessed/performed          No past medical history on file. No past surgical history on file. Patient Active Problem List   Diagnosis Date Noted   Foot pain, right 05/15/2022   Benign essential hypertension 09/02/2020   Diabetes (HCC) 09/02/2020   Dyslipidemia, goal LDL below 100 09/02/2020   Esophageal reflux 09/02/2020   Irritable bowel syndrome without diarrhea 09/02/2020   Primary osteoarthritis of knees, bilateral 09/02/2020   Psoriasis 09/02/2020   Vitamin D deficiency 09/02/2020   Abscess and cellulitis 02/08/2015   Recurrent umbilical hernia 10/23/2014   Hair loss 09/10/2014   Ventral hernia 09/10/2014    PCP: Dorenda Mormon, MD  REFERRING PROVIDER: Bert Damita Skene, MD  REFERRING DIAG: 189.0  THERAPY DIAG:  Lymphedema, not elsewhere classified  Rationale for Evaluation and Treatment: Rehabilitation  ONSET DATE: 1.5 yrs after an episode of shingles  SUBJECTIVE:                                                                                                                                                                                           SUBJECTIVE STATEMENT: Hayley Barr presents to Occupational Therapy for follow along to support self-management phase of CDT for BLE lymphedema. Pt walks to clinic using a single point cane instead of using a transport manual wc. Pt brings custom compression knee highs to clinic for fitting. She was measured for these on  90/19/25. Pt states she is doing very well and has no new complaints.  PERTINENT HISTORY: HTN, OA, DM type 2, pre-existing B leg and foot lymphedema 2/2 suspected venous insufficiency, R>L. R leg Fx with reconstruction of Achilles;  chronic plantar  fasciitis, Negative for DVT in 1923, Hx R foot abscess and cellulitis, IBS, Psoriasis, bilateral neuropathy feet and legs, currently has PT 2 x weekly   PAIN:  Are you having pain? no, 0/10 Location: B legs, feet and ankles, R>L Description: heaviness, stiffness, tightness What makes it better? elevation What makes it worse? Standing, walking, dependent sitting  PRECAUTIONS: Fall Risk;  Infection Risk, LYMPHEDEMA precautions, DM type 2 precautions  WEIGHT BEARING RESTRICTIONS: No  FALLS:  Has patient fallen in last 6 months? Yes. 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  COGNITION:  Overall cognitive status: Within functional limits for tasks assessed   OBSERVATIONS / OTHER ASSESSMENTS:  Mild-moderate, BLE lymphedema 2/2 suspected venous insufficiency and obesity ( weight induced lymphedema.  POSTURE: WFL  LE ROM: WFL for  clinical tasks  LE MMT: WFL for clinical task  LYMPHEDEMA ASSESSMENTS:   SURGERY TYPE/DATE: N/A, non cancer related  Hx INFECTIONS: NONE     Hx WOUNDS: currently has a dime sized non healing wound at base of R heel incision from  achilles reconstruction   FOTO functional outcome measure: Initial 09/29/23: 50%- 02/04/24: FOTO Discontinued  Lymphedema Life Impact Scale (LLIS) Initial 09/29/23: 47.06%  BLE COMPARATIVE LIMB VOLUMETRICS:   11/04/23 Initial  LANDMARK RIGHT   R LEG (A-D) 6686.8 ml  R THIGH (E-G) ml  R FULL LIMB (A-G) ml  Limb Volume differential (LVD)  0.5 %, L>R  Volume change since initial %  Volume change overall V  (Blank rows = not tested)  LANDMARK LEFT   L LEG (A-D) 6743.7 ml  L THIGH (E-G) ml  L FULL LIMB (A-G) ml  Limb Volume differential (LVD)  %  Volume change since initial %  Volume change overall %   (Blank rows = not tested)  6th visit RLE 12/10/23  LANDMARK RIGHT   R LEG (A-D) 5451.6 ml  R THIGH (E-G) ml  R FULL LIMB (A-G) ml  Limb Volume differential (LVD)  %  Volume change since initial  DEC 18.47% since 11/04/23  Volume change overall V  (Blank rows = not tested)   9th visit RLE 12/24/23  [facility] RIGHT   R LEG (A-D) 5451.6 ml  R THIGH (E-G) ml  R FULL LIMB (A-G) ml  Limb Volume differential (LVD)  %  Volume change since initial DECREASED 22.4% overall  Volume change since 12/10/23 DECREASED 4.8%  (Blank rows = not tested)  LLE COMPARATIVE LIMB VOLUMETRICS:   02/12/24 19 th visit ( last measured LE at initial Rx session on 11/04/23)   LANDMARK LEFT   L LEG (A-D) 5598.5 ml  L THIGH (E-G) ml  L FULL LIMB (A-G) ml  Limb Volume differential (LVD)   7.8%, L>R%  Volume change since initial  DECREASED 16.98%  Volume change overall %   (Blank rows = not tested)  GAIT: Distance walked: arrived in transport wc. Able to transfer to Rx bed and back to transport wc using Single point cane (modified independence)  TODAY'S TREATMENT:   Assessment of custom compression stockings fit and function  Pt education re  LE self-care-ongoing and  progress to date   PATIENT EDUCATION:  Continued Pt/ CG edu for lymphedema self care home program throughout session. Topics include outcome of comparative limb volumetrics- starting limb volume differentials (LVDs), technology and gradient techniques used for short stretch, multilayer compression wrapping, simple self-MLD, therapeutic lymphatic pumping exercises, skin/nail care, LE precautions,. compression garment recommendations and specifications, wear and care schedule and compression garment donning / doffing w assistive devices. Discussed progress towards all OT goals since commencing CDT. All questions answered to the Pt's satisfaction. Good return. Person educated: Patient and family Education method: Explanation, Demonstration, and Handouts Education comprehension: verbalized understanding, returned demonstration, verbal cues required, and needs further education  HOME EXERCISE PROGRAM: BLE lymphatic pumping there ex using- 1 set of 10 reps, each exercise in order-  1-2 x daily, bilaterally Simple self MLD 1 x daily Daily skin care and inspection to increase hydration, skin mobility and decrease infection risk- can be done during MLD Compression wraps 23/7 until garment fitting complete (multilayer compression wraps to R LEG using 1 each of 8, 10, and 12 cm wide short stretch compression wraps over single layer of stockinett and Rosidal Soft foam ( 0.4 cm thick) . Finished wraps with stretch net to keep tape in place. )  ASSESSMENT: CLINICAL IMPRESSION: Completed fitting for replacements of initial custom compression knee highs. Fit is excellent and Pt expresses comfort in garments.   In keeping with recommendation Pt replaced custom, flat knit, ccl 2 ( 23-32 mmHg), Jobst, Elvarex Classic, knee length compression stockings with like garments. Ensure garments have slant open toe, T-heel, 2.5 cm wide silicone top band, oblique top edge, and tricot pocket sewn on all for sides at anterior  ankle to limit skin irritation.  Custom-made gradient compression garments and HOS devices are medically necessary because they are uniquely sized and shaped to fit the exact dimensions of the affected extremities, and to provide appropriate medical grade, graduated compression essential for optimally managing chronic, progressive lymphedema. Multiple custom compression garments are needed to ensure proper hygiene to limit infection risk. Custom compression garments should be replaced q 3-6 months When worn consistently for optimal lymphedema self-management over time. HOS devices, medically necessary to limit fibrosis buildup in tissue, should be replaced q 2 years and PRN when worn out.   Pt will call PRN for support with LE self management. Continue follow-along support PRN.   (10/29/23 INITIAL EVALUATION:  Hayley Barr is a 68 yo female presenting with mild, stage 2, BLE lymphedema, R>L.  Longstanding, mild, B ankle swelling is exacerbated for the past 1.5 years s/P an episode of shingles affecting the R leg, and a subsequent fall resulting in ankle fracture with Achilles reconstruction and ongoing, non-healing wound at the distal end of surgical scar. This situation is concerning re elevated infection risk from lymphatic dysfunction in the context of DM type II,  and ongoing functional decline due to associated problems with ambulation and functional mobility.  Lymphedema occurs secondary to orthopedic trauma when the injury damages lymphatic vessels and structures in the area, leading to a buildup of fluid carrying cellular debris, waste products, bacteria, viruses, and damaged or abnormal cells in the tissues causing protein-rich swelling in the affected body part. This secondary lymphedema, often arising from significant soft tissue trauma accompanying the fracture, can potentially delay wound healing and complicate fracture management.   Progressing BLE lymphedema limits Hayley Barr's functional  performance in all occupational domains, including functional ambulation and transfers,  standing tolerance, dependent sitting for more than 15 minutes,  basic and instrumental ADLs performance, including lower body dressing, LB bathing, fitting preferred street shoes and LB clothing; driving, shopping, and home management . Lymphedema and associated pain limits Pt's ability to perform productive activities and  leisure pursuits and to participate in socialization at home and in the community.  BLE lymphedema contributes to elevated infection risk and increased falls risk due to body asymmetry. Hayley Barr will benefit from skilled OT for Complete Decongestive Therapy (CDT) the gold standard of lymphedema care, including manual lymphatic drainage (MLD), skin care to limit infection risk and increase skin excursion, lymphatic pumping exercise, and during the Intensive Phase multilayer, gradient compression bandaging to reduce limb volume. Once limb volume reduction reaches a clinical plateau custom compression garments that provide appropriate fit, compression and containment are fitted. Throughout the treatment course Pt/ caregiver will learn lymphedema prevention strategies and precautions, learn to perform all LE self-care home program components. Without skilled OT for lymphedema care, Pt's condition will progress and further functional decline is expected.)    OBJECTIVE IMPAIRMENTS: Abnormal gait, decreased activity tolerance, decreased balance, decreased knowledge of condition, decreased knowledge of use of DME, decreased mobility, difficulty walking, decreased ROM, decreased strength, increased edema, impaired flexibility, impaired sensation, improper body mechanics, obesity, pain, and chronic , progressive, BLE swelling with related tissue changes and thickening, increased infection risk, non-healing wound.   ACTIVITY LIMITATIONS: Difficulty performing basic and instrumental ADLs (LB dressing, LB  bathing,bathing, toileting, dressing, hygiene/grooming, and functional ambulation. Difficulty performing leisure pursuits and productive activities requiring standing and/ or walking for > 19 minutes  PARTICIPATION LIMITATIONS:  Impaired social participation, decreased participation in church  PERSONAL FACTORS: Behavior pattern, Fitness, Past/current experiences, Social background, Time since onset of injury/illness/exacerbation, and 3+ comorbidities: OA, hx R ankle fracture and non-healing wound, hx plantar fasciitis are also affecting patient's functional outcome.   REHAB POTENTIAL: Good  CLINICAL DECISION MAKING: Evolving/moderate complexity  EVALUATION COMPLEXITY: Moderate   GOALS: Goals reviewed with patient? Yes  SHORT TERM GOALS: Target date: 4th OT Rx visit   Pt will demonstrate understanding of lymphedema precautions and prevention strategies with modified independence using a printed reference to identify at least 5 precautions and discussing how s/he may implement them into daily life to reduce risk of progression with extra time. Baseline:Max A Goal status: GOAL MET  2.  Pt will be able to apply multilayer, knee length, gradient, compression wraps to one leg at a time from toes to below knee with modified independence (extra time) to decrease limb volume, to limit infection risk, and to limit lymphedema progression.  Baseline: Dependent Goal status: GOAL MET  LONG TERM GOALS: Target date: 02/18/24  Given this patient's Intake score of 50 % on the functional outcomes FOTO tool, patient will experience an increase in function of 5 points to improve basic and instrumental ADLs performance, including lymphedema self-care.  Baseline: 50 Goal status: GOAL DEFERRED. FOTO tool discontinued.  2.  Given this patient's Intake score of 47.06 % on the Lymphedema Life Impact Scale (LLIS), patient will experience a reduction of at least 5 points in her perceived level of functional  impairment resulting from lymphedema to improve functional performance and quality of life (QOL). Baseline: 47.06 % Goal status: PROGRESSING  3.  Pt will achieve at least a 10% volume reduction in B legs to return limb to typical size and shape, to limit infection risk and LE progression, to decrease pain, to improve function. Baseline:  Dependent Goal status: Achieved w R LEG w/ overall reduction to date of 22.4%. 02/12/24 LLE decreased by 16.98%. GOAL MET  4.  Pt will obtain appropriate compression garments/devices and achieve modified independence (extra time + assistive devices) with donning/doffing to optimize limb volume reductions and limit LE progression over time. Baseline: Dependent Goal status: GOAL MET. Replaced 09/14/24  5. During Intensive phase CDT , with modified independence, Pt will achieve at least 85% compliance with all lymphedema self-care home program components, including daily skin care, compression wraps and /or garments, simple self MLD and lymphatic pumping therex to habituate LE self care protocol  into ADLs for optimal LE self-management over time. Baseline: Dependent Goal status:GOAL MET  PLAN:  OT FREQUENCY: 3 month f/u and PRN  OT DURATION: 12 weeks and PRN  PLANNED INTERVENTIONS: 97110-Therapeutic exercises, 97530- Therapeutic activity, 97140- Manual therapy, Dry Needling, Manual lymph drainage, and Compression bandaging Complete Decongestive Therapy: Manual lympathic drainage, skin care,    compression wraps,, then fit with appropriate compression garments during Self-management Phase.  PLAN FOR NEXT SESSION:  Check limb volumetrics Check garment condition. Repeat new specifications and/ or measurements PRN Check skin condition Pt edu  Zebedee Dec, Hayley, OTR/L, CLT-LANA 09/14/24 11:51 AM

## 2024-12-21 ENCOUNTER — Encounter: Admitting: Obstetrics & Gynecology

## 2025-02-16 ENCOUNTER — Encounter: Admitting: Obstetrics & Gynecology

## 2025-03-10 ENCOUNTER — Encounter: Admitting: Obstetrics & Gynecology
